# Patient Record
Sex: Male | Born: 1960 | Race: Black or African American | Hispanic: No | Marital: Single | Smoking: Never smoker
Health system: Southern US, Community
[De-identification: ages and names within clinical notes are randomized; demographics above are authoritative.]

## PROBLEM LIST (undated history)

## (undated) DIAGNOSIS — E669 Obesity, unspecified: Secondary | ICD-10-CM

## (undated) DIAGNOSIS — L309 Dermatitis, unspecified: Secondary | ICD-10-CM

## (undated) DIAGNOSIS — E785 Hyperlipidemia, unspecified: Secondary | ICD-10-CM

## (undated) DIAGNOSIS — I10 Essential (primary) hypertension: Secondary | ICD-10-CM

## (undated) DIAGNOSIS — N529 Male erectile dysfunction, unspecified: Secondary | ICD-10-CM

## (undated) DIAGNOSIS — T7840XA Allergy, unspecified, initial encounter: Secondary | ICD-10-CM

## (undated) DIAGNOSIS — G4730 Sleep apnea, unspecified: Secondary | ICD-10-CM

## (undated) DIAGNOSIS — E119 Type 2 diabetes mellitus without complications: Secondary | ICD-10-CM

## (undated) HISTORY — DX: Essential (primary) hypertension: I10

## (undated) HISTORY — DX: Hyperlipidemia, unspecified: E78.5

## (undated) HISTORY — PX: KNEE ARTHROSCOPY: SUR90

## (undated) HISTORY — DX: Obesity, unspecified: E66.9

## (undated) HISTORY — DX: Male erectile dysfunction, unspecified: N52.9

## (undated) HISTORY — DX: Dermatitis, unspecified: L30.9

## (undated) HISTORY — DX: Allergy, unspecified, initial encounter: T78.40XA

## (undated) HISTORY — PX: NASAL SINUS SURGERY: SHX719

---

## 2003-07-29 ENCOUNTER — Emergency Department (HOSPITAL_COMMUNITY): Admission: EM | Admit: 2003-07-29 | Discharge: 2003-07-29 | Payer: Self-pay | Admitting: Emergency Medicine

## 2004-03-24 ENCOUNTER — Ambulatory Visit (HOSPITAL_BASED_OUTPATIENT_CLINIC_OR_DEPARTMENT_OTHER): Admission: RE | Admit: 2004-03-24 | Discharge: 2004-03-24 | Payer: Self-pay | Admitting: Orthopedic Surgery

## 2004-03-24 ENCOUNTER — Ambulatory Visit (HOSPITAL_COMMUNITY): Admission: RE | Admit: 2004-03-24 | Discharge: 2004-03-24 | Payer: Self-pay | Admitting: Orthopedic Surgery

## 2004-05-15 ENCOUNTER — Encounter: Admission: RE | Admit: 2004-05-15 | Discharge: 2004-07-23 | Payer: Self-pay | Admitting: Orthopedic Surgery

## 2005-02-17 ENCOUNTER — Ambulatory Visit: Payer: Self-pay | Admitting: Family Medicine

## 2005-12-10 ENCOUNTER — Emergency Department (HOSPITAL_COMMUNITY): Admission: EM | Admit: 2005-12-10 | Discharge: 2005-12-10 | Payer: Self-pay | Admitting: Emergency Medicine

## 2005-12-22 ENCOUNTER — Ambulatory Visit: Payer: Self-pay | Admitting: Family Medicine

## 2005-12-22 ENCOUNTER — Ambulatory Visit: Payer: Self-pay | Admitting: *Deleted

## 2006-06-11 ENCOUNTER — Ambulatory Visit: Payer: Self-pay | Admitting: Family Medicine

## 2006-06-17 ENCOUNTER — Ambulatory Visit: Payer: Self-pay | Admitting: Family Medicine

## 2006-06-24 ENCOUNTER — Ambulatory Visit: Payer: Self-pay | Admitting: Family Medicine

## 2007-06-09 ENCOUNTER — Telehealth: Payer: Self-pay | Admitting: Family Medicine

## 2007-06-24 ENCOUNTER — Ambulatory Visit: Payer: Self-pay | Admitting: Family Medicine

## 2007-06-24 DIAGNOSIS — I10 Essential (primary) hypertension: Secondary | ICD-10-CM | POA: Insufficient documentation

## 2007-10-18 ENCOUNTER — Ambulatory Visit: Payer: Self-pay | Admitting: Family Medicine

## 2007-11-03 ENCOUNTER — Ambulatory Visit: Payer: Self-pay | Admitting: Family Medicine

## 2007-11-03 DIAGNOSIS — J45909 Unspecified asthma, uncomplicated: Secondary | ICD-10-CM | POA: Insufficient documentation

## 2008-06-26 ENCOUNTER — Ambulatory Visit: Payer: Self-pay | Admitting: Family Medicine

## 2008-06-26 DIAGNOSIS — K429 Umbilical hernia without obstruction or gangrene: Secondary | ICD-10-CM | POA: Insufficient documentation

## 2008-06-26 DIAGNOSIS — F528 Other sexual dysfunction not due to a substance or known physiological condition: Secondary | ICD-10-CM | POA: Insufficient documentation

## 2008-07-16 ENCOUNTER — Telehealth: Payer: Self-pay | Admitting: *Deleted

## 2008-07-25 ENCOUNTER — Telehealth: Payer: Self-pay | Admitting: Family Medicine

## 2008-12-14 ENCOUNTER — Ambulatory Visit: Payer: Self-pay | Admitting: Family Medicine

## 2008-12-14 ENCOUNTER — Encounter: Payer: Self-pay | Admitting: Family Medicine

## 2009-09-16 DIAGNOSIS — H8110 Benign paroxysmal vertigo, unspecified ear: Secondary | ICD-10-CM | POA: Insufficient documentation

## 2009-09-17 ENCOUNTER — Ambulatory Visit: Payer: Self-pay | Admitting: Family Medicine

## 2009-10-02 ENCOUNTER — Ambulatory Visit: Payer: Self-pay | Admitting: Family Medicine

## 2009-10-02 LAB — CONVERTED CEMR LAB
BUN: 13 mg/dL (ref 6–23)
Bilirubin, Direct: 0 mg/dL (ref 0.0–0.3)
CO2: 30 meq/L (ref 19–32)
Cholesterol: 201 mg/dL — ABNORMAL HIGH (ref 0–200)
Direct LDL: 165.1 mg/dL
Eosinophils Absolute: 0 10*3/uL (ref 0.0–0.7)
Eosinophils Relative: 1.1 % (ref 0.0–5.0)
GFR calc non Af Amer: 114.68 mL/min (ref >60)
HCT: 41.5 % (ref 39.0–52.0)
Ketones, urine, test strip: NEGATIVE
Lymphs Abs: 1.8 10*3/uL (ref 0.7–4.0)
MCHC: 33.1 g/dL (ref 30.0–36.0)
Monocytes Relative: 8.3 % (ref 3.0–12.0)
Nitrite: NEGATIVE
PSA: 1.18 ng/mL (ref 0.10–4.00)
Potassium: 3.6 meq/L (ref 3.5–5.1)
Protein, U semiquant: NEGATIVE
Sodium: 139 meq/L (ref 135–145)
Total Bilirubin: 0.8 mg/dL (ref 0.3–1.2)
Total Protein: 7.6 g/dL (ref 6.0–8.3)
Urobilinogen, UA: 1
WBC Urine, dipstick: NEGATIVE
WBC: 4.2 10*3/uL — ABNORMAL LOW (ref 4.5–10.5)
pH: 7

## 2009-10-09 ENCOUNTER — Ambulatory Visit: Payer: Self-pay | Admitting: Family Medicine

## 2009-10-09 DIAGNOSIS — E1169 Type 2 diabetes mellitus with other specified complication: Secondary | ICD-10-CM | POA: Insufficient documentation

## 2009-10-09 DIAGNOSIS — E785 Hyperlipidemia, unspecified: Secondary | ICD-10-CM

## 2009-10-15 ENCOUNTER — Ambulatory Visit: Payer: Self-pay | Admitting: Family Medicine

## 2009-10-15 DIAGNOSIS — M25569 Pain in unspecified knee: Secondary | ICD-10-CM | POA: Insufficient documentation

## 2009-10-16 ENCOUNTER — Encounter (INDEPENDENT_AMBULATORY_CARE_PROVIDER_SITE_OTHER): Payer: Self-pay | Admitting: *Deleted

## 2009-10-22 ENCOUNTER — Encounter (INDEPENDENT_AMBULATORY_CARE_PROVIDER_SITE_OTHER): Payer: Self-pay | Admitting: *Deleted

## 2009-10-23 ENCOUNTER — Ambulatory Visit: Payer: Self-pay | Admitting: Gastroenterology

## 2009-11-06 ENCOUNTER — Ambulatory Visit: Payer: Self-pay | Admitting: Gastroenterology

## 2009-11-28 ENCOUNTER — Ambulatory Visit: Payer: Self-pay | Admitting: Family Medicine

## 2009-11-28 LAB — CONVERTED CEMR LAB
AST: 20 units/L (ref 0–37)
Albumin: 3.4 g/dL — ABNORMAL LOW (ref 3.5–5.2)
Alkaline Phosphatase: 71 units/L (ref 39–117)
Bilirubin, Direct: 0 mg/dL (ref 0.0–0.3)
LDL Cholesterol: 93 mg/dL (ref 0–99)
Total Bilirubin: 0.5 mg/dL (ref 0.3–1.2)
Triglycerides: 59 mg/dL (ref 0.0–149.0)

## 2009-12-05 ENCOUNTER — Ambulatory Visit: Payer: Self-pay | Admitting: Family Medicine

## 2009-12-17 ENCOUNTER — Ambulatory Visit: Payer: Self-pay | Admitting: Family Medicine

## 2009-12-17 DIAGNOSIS — L301 Dyshidrosis [pompholyx]: Secondary | ICD-10-CM | POA: Insufficient documentation

## 2009-12-27 ENCOUNTER — Ambulatory Visit: Payer: Self-pay | Admitting: Family Medicine

## 2010-01-20 ENCOUNTER — Ambulatory Visit: Payer: Self-pay | Admitting: Pulmonary Disease

## 2010-01-21 ENCOUNTER — Ambulatory Visit: Payer: Self-pay | Admitting: Internal Medicine

## 2010-01-22 ENCOUNTER — Telehealth: Payer: Self-pay | Admitting: Pulmonary Disease

## 2010-11-10 ENCOUNTER — Ambulatory Visit: Admit: 2010-11-10 | Payer: Self-pay | Admitting: Family Medicine

## 2010-11-18 NOTE — Assessment & Plan Note (Signed)
Summary: 2 MONTH ROV/NJR   Vital Signs:  Patient profile:   50 year old male Weight:      301 pounds Temp:     98.9 degrees F oral BP sitting:   120 / 88  (left arm) Cuff size:   large  Vitals Entered By: Kern Reap CMA Duncan Dull) (December 05, 2009 9:19 AM)  Reason for Visit follow up labs  History of Present Illness: Jim Green. is a 50 year old male, nonsmoker, who comes in today for evaluation.  Three problems.  He has underlying hypertension and is on lisinopril 20 -- 25 daily.  BP 120/88.  He takes simvastatin 20 mg nightly lipids to draw back to normal.  He is also complaining of a new problem of cough.  He, thinks it's related allergy.  I explained it could be the lisinopril to pain.  He said no, sputum production, etc., etc.  Allergies: No Known Drug Allergies  Past History:  Past medical, surgical, family and social histories (including risk factors) reviewed for relevance to current acute and chronic problems.  Past Medical History: Reviewed history from 10/15/2009 and no changes required. Hypertension obesity eczema allergic rhinitis ED umbilical hernia right knee pain  Past Surgical History: Reviewed history from 10/15/2009 and no changes required. right knee arthroscopy per Dr. Eulah Pont 2005  Family History: Reviewed history from 11/03/2007 and no changes required. Family Hsitory Headaches mother has peripheral vascular disease and coronary disease 3 brothers, one has high blood pressure 3 sisters, one is obese  Social History: Reviewed history from 06/26/2008 and no changes required. Occupation: Married Never Smoked Alcohol use-no Drug use-no Regular exercise-no  Review of Systems      See HPI  Physical Exam  General:  Well-developed,well-nourished,in no acute distress; alert,appropriate and cooperative throughout examination   Impression & Recommendations:  Problem # 1:  HYPERLIPIDEMIA (ICD-272.4) Assessment Improved  His updated medication  list for this problem includes:    Simvastatin 20 Mg Tabs (Simvastatin) .Marland Kitchen... 1 tab @ bedtime  Problem # 2:  HYPERTENSION (ICD-401.9) Assessment: Improved  His updated medication list for this problem includes:    Lisinopril-hydrochlorothiazide 20-25 Mg Tabs (Lisinopril-hydrochlorothiazide) .Marland Kitchen... Take 1 tablet by mouth once a day  Problem # 3:  COUGH (ICD-786.2) Assessment: New  Complete Medication List: 1)  Lisinopril-hydrochlorothiazide 20-25 Mg Tabs (Lisinopril-hydrochlorothiazide) .... Take 1 tablet by mouth once a day 2)  Viagra 100 Mg Tabs (Sildenafil citrate) .... Uad 3)  Simvastatin 20 Mg Tabs (Simvastatin) .Marland Kitchen.. 1 tab @ bedtime  Patient Instructions: 1)  continue the simvastatin 20 mg daily, along with your lisinopril.  Her blood pressure daily. 2)  Take 10 mg of Claritin plain at bedtime.  If after two to 3 weeks.  The cough does not go away and let us know.  Then we would consider changing his lisinopril. 3)  Walk 15 minutes daily. 4)  Return for your annual physical exam

## 2010-11-18 NOTE — Assessment & Plan Note (Signed)
Summary: acute chest wall pain/apc   Visit Type:  Initial Consult Copy to:  pcp Primary Provider/Referring Provider:  Roderick Pee MD  CC:  Pt here for pulmonary consult. Pt c/o chest wall pain with exertion that d/c x 4 to 5 days ago starting x 2 to 3 days .  History of Present Illness: 48/F never smoker for evaluation of chest pain. He presented mid Feb for annual physical & reported a cough attributed to allergies or lisinopril. He then came back on 3/1 with acute onset chest wall pain initially on left then radiating to right.  He described it as dull, a 7 on a scale of one to 10 it was intermittent, not related to exertion.  It does not hurt if he lies still.  If he takes a deep breath or twists or moves. He was out of work x 2 days because of the chest wall pain.  Aleve helped more than motrin & he hsa been pain free x 6 days now.No history of any cardiac or pulmonary symptoms.  No history of trauma. CXR wnl.  On further questioning , he states he has had this pain at other times in the past too. No family h/o thromboembolic episodes. Of note, he underwent what seems like UPPP by Dr Haroldine Laws for snoring in the 90's.  Preventive Screening-Counseling & Management  Alcohol-Tobacco     Alcohol drinks/day: <1     Alcohol type: spirits     Smoking Status: never  Current Medications (verified): 1)  Lisinopril-Hydrochlorothiazide 20-25 Mg Tabs (Lisinopril-Hydrochlorothiazide) .... Take 1 Tablet By Mouth Once A Day 2)  Simvastatin 20 Mg Tabs (Simvastatin) .Marland Kitchen.. 1 Tab @ Bedtime 3)  Triamcinolone Acetonide 0.1 % Crea (Triamcinolone Acetonide) .... Apply Two Times A Day 4)  Claritin 10 Mg Tabs (Loratadine) .... Take 1 Tablet By Mouth Once A Day As Needed  Allergies (verified): No Known Drug Allergies  Past History:  Past Medical History: Hypertension obesity eczema allergic rhinitis ED umbilical hernia right knee pain Hyperlipidemia  Past Surgical History: right knee  arthroscopy per Dr. 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  The patient denies shortness of breath with activity, shortness of breath at rest, productive cough, non-productive cough, coughing up blood, irregular heartbeats, acid heartburn, indigestion, loss of appetite, weight change, abdominal pain, difficulty swallowing, sore throat, tooth/dental problems, headaches, nasal congestion/difficulty breathing through nose, sneezing, itching, ear ache, anxiety, depression, hand/feet swelling, joint stiffness or pain, rash, change in color of mucus, and fever.    Vital Signs:  Patient profile:   50 year old male Height:      67 inches Weight:      308.25 pounds O2 Sat:      94 % on Room air Temp:     98.6 degrees F oral Pulse rate:   78 / minute BP sitting:   126 / 80  (left arm) Cuff size:   large  Vitals Entered By: Zackery Barefoot CMA (January 20, 2010 3:53 PM)  O2 Flow:  Room air CC: Pt here for pulmonary consult. Pt c/o chest wall pain with exertion  that d/c x 4 to 5 days ago starting x 2 to 3 days  Comments Medications reviewed with patient Verified contact number and pharmacy with patient Zackery Barefoot CMA  January 20, 2010 3:55 PM    Physical Exam  Additional Exam:  Gen. Pleasant, well-nourished, in no distress, normal affect ENT - no lesions, no post nasal drip Neck: No JVD, no thyromegaly, no carotid bruits Lungs: no use of accessory muscles, no dullness to percussion, clear without rales or rhonchi  Cardiovascular: Rhythm regular, heart sounds  normal,  no murmurs or gallops, no peripheral edema Abdomen: soft and non-tender, no hepatosplenomegaly, BS normal. Musculoskeletal: No deformities, no cyanosis or clubbing Neuro:  alert, non focal     Impression & Recommendations:  Problem # 1:  CHEST WALL PAIN, ACUTE (ICD-786.52) Likely musculoskeletal , given response to aleve. Doubt pleurisy - no evidence of shingles. Non exertional , less likley cardiac. Low risk for thromboembolic phenomena - will obatin CT chest since he states this has happened before. Orders: Consultation Level III [phone]) Radiology Referral (Radiology)  Medications Added to Medication List This Visit: 1)  Claritin 10 Mg Tabs (Loratadine) .... Take 1 tablet by mouth once a day as needed  Patient Instructions: 1)  Copy sent to: Dr Tawanna Cooler 2)  Please schedule a follow-up appointment after your CT scan.

## 2010-11-18 NOTE — Progress Notes (Signed)
Summary: returned call  Phone Note Call from Patient Call back at Home Phone [phone]   Caller: Patient Call For: alva Summary of Call: Returning call from yesterday. Initial call taken by: Darletta Moll,  January 22, 2010 9:29 AM  Follow-up for Phone Call        [facility] Vernie Murders  January 22, 2010 9:43 AM  pt advised of CT results per append. Carron Curie CMA  January 22, 2010 9:50 AM

## 2010-11-18 NOTE — Miscellaneous (Signed)
Summary: [facility] Previsit/prep  Clinical Lists Changes  Medications: Added new medication of MOVIPREP 100 GM  SOLR (PEG-KCL-NACL-NASULF-NA ASC-C) As per prep instructions. - Signed Rx of MOVIPREP 100 GM  SOLR (PEG-KCL-NACL-NASULF-NA ASC-C) As per prep instructions.;  #1 x 0;  Signed;  Entered by: [name] RN;  Authorized by: Mardella Layman MD [facility];  Method used: Electronically to [facility] [phone]*, [address], [place], [place]  [address], Ph: [phone], Fax: [phone] Observations: Added new observation of NKA: T (10/23/2009 15:45)    Prescriptions: MOVIPREP 100 GM  SOLR (PEG-KCL-NACL-NASULF-NA ASC-C) As per prep instructions.  #1 x 0   Entered by:   [name] RN   Authorized by:   Mardella Layman MD [facility]   Signed by:   [name] RN on 10/23/2009   Method used:   Electronically to        [facility] [phone]* (retail)       [address]       [place], [place]  [address]       Ph: [phone]       Fax: [phone]   RxID:   [id number]

## 2010-11-18 NOTE — Procedures (Signed)
Summary: Colonoscopy  Patient: Jim Green Note: All result statuses are Final unless otherwise noted.  Tests: (1) Colonoscopy (COL)   COL Colonoscopy           DONE     [facility]     [address]     [place], [place]  [address]           COLONOSCOPY PROCEDURE REPORT           PATIENT:  [name], [name]  MR#:  [id number]     BIRTHDATE:  1960-11-28, 48 yrs. old  GENDER:  male           ENDOSCOPIST:  Vania Rea. Jarold Motto, MD, [facility]     Referred by:  Eugenio Hoes Tawanna Cooler, M.D.           PROCEDURE DATE:  11/06/2009     PROCEDURE:  Colonoscopy with snare polypectomy     ASA CLASS:  Class II     INDICATIONS:  Colorectal Cancer Screening           MEDICATIONS:   Fentanyl 75 mcg IV, Versed 9 mg IV           DESCRIPTION OF PROCEDURE:   After the risks benefits and     alternatives of the procedure were thoroughly explained, informed     consent was obtained.  Digital rectal exam was performed and     revealed no abnormalities.   The LB [id number] endoscope     was introduced through the anus and advanced to the cecum, which     was identified by both the appendix and ileocecal valve, limited     by poor preparation.  very poor prep throughout entire colon.  The     quality of the prep was poor, using MoviPrep.  The instrument was     then slowly withdrawn as the colon was fully examined.     <<PROCEDUREIMAGES>>           FINDINGS:  A sessile polyp was found in the descending colon. It     was bilobed and fleshy. 1 cm. polyp Polyp was snared, then     cauterized with monopolar cautery. Retrieval was unsuccessful.     snare polyp poor prep precluded polyp retrieval.  This was     otherwise a normal examination of the colon.   Retroflexed views     in the rectum revealed no abnormalities.    The scope was then     withdrawn from the patient and the procedure completed.           COMPLICATIONS:  None           ENDOSCOPIC IMPRESSION:     1) Sessile polyp in the  descending colon     2) Otherwise normal examination     LEFT COLON ADENOMA RESECTED.INADEQUATE PREP.     RECOMMENDATIONS:     3 YEAR F/U WITH PILL PREP FOLLOWED WITH 1LITER OF GOLYTELY.           REPEAT EXAM:  No           ______________________________     Vania Rea. Jarold Motto, MD, [facility]           CC:           n.     eSIGNED:   Vania Rea. Patterson at 11/06/2009 10:17 AM           Alyse Low, [id number]  Note: An exclamation  mark (!) indicates a result that was not dispersed into the flowsheet. Document Creation Date: 11/06/2009 10:17 AM _______________________________________________________________________  (1) Order result status: Final Collection or observation date-time: 11/06/2009 10:08 Requested date-time:  Receipt date-time:  Reported date-time:  Referring Physician:   Ordering Physician: Sheryn Bison [phone]) Specimen Source:  Source: Launa Grill Order Number: [id number] Lab site:

## 2010-11-18 NOTE — Assessment & Plan Note (Signed)
Summary: back pain/not any better/getting worse/cjr   Vital Signs:  Patient profile:   50 year old male Weight:      300 pounds Temp:     99.4 degrees F oral BP sitting:   110 / 70  (left arm) Cuff size:   large  Vitals Entered By: Kern Reap CMA Duncan Dull) (December 27, 2009 12:25 PM)  Reason for Visit increased back pain  History of Present Illness: Jim Green. is a 50 year old male, nonsmoker, who comes in today for reevaluation of chest wall pain.  He, states he's had this chest wall pain now for two to 3 months.  He describes it as intermittent, dull, and 8 on a scale of one to 10 !!!!!!!!!!!!!!!!!!!!!, located to the right upper anterior chest wall and the right subscapular area.  The discomfort comes and goes........ today  He is asymptomatic.  The pain does not wake him up at night.  He has no cardiac, pulmonary, nor GI symptoms.  He relates the discomfort to after he got a flu shot !!!!!!!!!!!!!.  He states it hurts when he moves or when he lies down and goes to sleep.  The discomfort goes away.  He has no difficulty swallowing, breathing.  The discomfort tends to occur at rest is not exertional.  He has underlying hypertension, and hyperlipidemia both of which are under excellent control.  Allergies: No Known Drug Allergies  Past History:  Past medical, surgical, family and social histories (including risk factors) reviewed, and no changes noted (except as noted below).  Past Medical History: Reviewed history from 10/15/2009 and no changes required. Hypertension obesity eczema allergic rhinitis ED umbilical hernia right knee pain  Past Surgical History: Reviewed history from 10/15/2009 and no changes required. right knee arthroscopy per Dr. Eulah Pont 2005  Family History: Reviewed history from 11/03/2007 and no changes required. Family Hsitory Headaches mother has peripheral vascular disease and coronary disease 3 brothers, one has high blood pressure 3 sisters, one is  obese  Social History: Reviewed history from 06/26/2008 and no changes required. Occupation: Married Never Smoked Alcohol use-no Drug use-no Regular exercise-no  Review of Systems      See HPI  Physical Exam  General:  Well-developed,well-nourished,in no acute distress; alert,appropriate and cooperative throughout examination Neck:  No deformities, masses, or tenderness noted. Chest Wall:  No deformities, masses, tenderness or gynecomastia noted. Lungs:  Normal respiratory effort, chest expands symmetrically. Lungs are clear to auscultation, no crackles or wheezes. Heart:  Normal rate and regular rhythm. S1 and S2 normal without gallop, murmur, click, rub or other extra sounds. Abdomen:  Bowel sounds positive,abdomen soft and non-tender without masses, organomegaly or hernias noted.   Impression & Recommendations:  Problem # 1:  CHEST WALL PAIN, ACUTE (ICD-786.52) Assessment Unchanged  Orders: T-2 View CXR ([id number]) Pulmonary Referral (Pulmonary)  Complete Medication List: 1)  Lisinopril-hydrochlorothiazide 20-25 Mg Tabs (Lisinopril-hydrochlorothiazide) .... Take 1 tablet by mouth once a day 2)  Viagra 100 Mg Tabs (Sildenafil citrate) .... Uad 3)  Simvastatin 20 Mg Tabs (Simvastatin) .Marland Kitchen.. 1 tab @ bedtime 4)  Triamcinolone Acetonide 0.1 % Crea (Triamcinolone acetonide) .... Apply two times a day  Patient Instructions: 1)  go to the main office now for a chest x-ray, we will get you set up for a pulmonary consult next week for further evaluation. 2)  In the meantime take 400 mg of Motrin 3 times a day with food

## 2010-11-18 NOTE — Assessment & Plan Note (Signed)
Summary: back pain/dm   Vital Signs:  Patient profile:   50 year old male Weight:      303 pounds Temp:     98.7 degrees F oral BP sitting:   110 / 70  (left arm) Cuff size:   large  Vitals Entered By: Kern Reap CMA Duncan Dull) (December 17, 2009 12:15 PM)  Reason for Visit follow up with back pain  History of Present Illness: Jim Green is a 50 year old male, nonsmoker, who comes in today for evaluation of multiple issues.  He has a history of present nasal drip and cough.  We start him on Claritin, 10 mg regular two weeks ago.  His symptoms are pretty much gone.  He takes lisinopril 20 -- 25 daily for hypertension.  BP 110/70.  He's been having some chest wall pain.  He describes it as dull, a 7 on a scale of one to 10 it is intermittent.  It does not hurt if he lies still.  If he takes a deep breath or twists or moves.  It hurts.  He was out of work yesterday and today because of the chest wall pain.  No history of any cardiac or pulmonary symptoms.  No history of trauma.  He also complaining of dryness and peeling of his hands.  Allergies: No Known Drug Allergies  Past History:  Past medical, surgical, family and social histories (including risk factors) reviewed for relevance to current acute and chronic problems. Past medical history reviewed for relevance to current acute and chronic problems.  Past Medical History: Reviewed history from 10/15/2009 and no changes required. Hypertension obesity eczema allergic rhinitis ED umbilical hernia right knee pain  Past Surgical History: Reviewed history from 10/15/2009 and no changes required. right knee arthroscopy per Dr. Eulah Pont 2005  Family History: Reviewed history from 11/03/2007 and no changes required. Family Hsitory Headaches mother has peripheral vascular disease and coronary disease 3 brothers, one has high blood pressure 3 sisters, one is obese  Social History: Reviewed history from 06/26/2008 and no  changes required. Occupation: Married Never Smoked Alcohol use-no Drug use-no Regular exercise-no  Review of Systems      See HPI  Physical Exam  General:  Well-developed,well-nourished,in no acute distress; alert,appropriate and cooperative throughout examination Neck:  No deformities, masses, or tenderness noted. Chest Wall:  No deformities, masses, tenderness or gynecomastia noted. Lungs:  Normal respiratory effort, chest expands symmetrically. Lungs are clear to auscultation, no crackles or wheezes. Heart:  Normal rate and regular rhythm. S1 and S2 normal without gallop, murmur, click, rub or other extra sounds. Skin:  bilateral eczema   Impression & Recommendations:  Problem # 1:  COUGH (ICD-786.2) Assessment Improved  Orders: Prescription Created Electronically [phone])  Problem # 2:  CHEST WALL PAIN, ACUTE (TKZ-601.09) Assessment: Unchanged  Orders: Prescription Created Electronically [phone])  Problem # 3:  HYPERTENSION (ICD-401.9) Assessment: Improved  His updated medication list for this problem includes:    Lisinopril-hydrochlorothiazide 20-25 Mg Tabs (Lisinopril-hydrochlorothiazide) .Marland Kitchen... Take 1 tablet by mouth once a day  Orders: Prescription Created Electronically [phone])  Problem # 4:  DYSHIDROSIS (ICD-705.81) Assessment: New  Orders: Prescription Created Electronically ([phone])  Complete Medication List: 1)  Lisinopril-hydrochlorothiazide 20-25 Mg Tabs (Lisinopril-hydrochlorothiazide) .... Take 1 tablet by mouth once a day 2)  Viagra 100 Mg Tabs (Sildenafil citrate) .... Uad 3)  Simvastatin 20 Mg Tabs (Simvastatin) .Marland Kitchen.. 1 tab @ bedtime 4)  Triamcinolone Acetonide 0.1 % Crea (Triamcinolone acetonide) .... Apply two times a day  Patient  Instructions: 1)  continue the regular Claritin, 10 mg daily. 2)  Continue your blood pressure medication daily. 3)  Take Motrin 600 mg twice a day with food for the chest wall pain. 4)  Applied.  The steroid  cream.  Small amounts twice a day as needed for the eczema     Prescriptions: TRIAMCINOLONE ACETONIDE 0.1 % CREA (TRIAMCINOLONE ACETONIDE) apply two times a day  #60 gr x 3   Entered and Authorized by:   Roderick Pee MD   Signed by:   Roderick Pee MD on 12/17/2009   Method used:   Electronically to        [facility] [phone]* (retail)       [address]       [place], [place]  [address]       Ph: [phone]       Fax: [phone]   RxID:   [id number]

## 2010-11-18 NOTE — Letter (Signed)
Summary: [facility] Instructions  [facility]  [address] [place], [place] [address]   Phone: [phone]  Fax: [phone]       Jim Green    [birth year]    MRN: [id number]        Procedure Day /Date: 11/06/09/  Wednesday     Arrival Time: 8:30am     Procedure Time: 9:30am     Location of Procedure:                    _ x_  [facility] (4th Floor)  PREPARATION FOR COLONOSCOPY WITH MOVIPREP   Starting 5 days prior to your procedure _ 1/14/11_ do not eat nuts, seeds, popcorn, corn, beans, peas,  salads, or any raw vegetables.  Do not take any fiber supplements (e.g. Metamucil, Citrucel, and Benefiber).  THE DAY BEFORE YOUR PROCEDURE         DATE: 11/05/09 DAY: Tuesday  1.  Drink clear liquids the entire day-NO SOLID FOOD  2.  Do not drink anything colored red or purple.  Avoid juices with pulp.  No orange juice.  3.  Drink at least 64 oz. (8 glasses) of fluid/clear liquids during the day to prevent dehydration and help the prep work efficiently.  CLEAR LIQUIDS INCLUDE: Water Jello Ice Popsicles Tea (sugar ok, no milk/cream) Powdered fruit flavored drinks Coffee (sugar ok, no milk/cream) Gatorade Juice: apple, white grape, white cranberry  Lemonade Clear bullion, consomm, broth Carbonated beverages (any kind) Strained chicken noodle soup Hard Candy                             4.  In the morning, mix first dose of MoviPrep solution:    Empty 1 Pouch A and 1 Pouch B into the disposable container    Add lukewarm drinking water to the top line of the container. Mix to dissolve    Refrigerate (mixed solution should be used within 24 hrs)  5.  Begin drinking the prep at 5:00 p.m. The MoviPrep container is divided by 4 marks.   Every 15 minutes drink the solution down to the next mark (approximately 8 oz) until the full liter is complete.   6.  Follow completed prep with 16 oz of clear liquid of your choice (Nothing red or purple).   Continue to drink clear liquids until bedtime.  7.  Before going to bed, mix second dose of MoviPrep solution:    Empty 1 Pouch A and 1 Pouch B into the disposable container    Add lukewarm drinking water to the top line of the container. Mix to dissolve    Refrigerate  THE DAY OF YOUR PROCEDURE      DATE:  11/06/09  DAY: Wednesday  Beginning at 4:30a.m. (5 hours before procedure):         1. Every 15 minutes, drink the solution down to the next mark (approx 8 oz) until the full liter is complete.  2. Follow completed prep with 16 oz. of clear liquid of your choice.    3. You may drink clear liquids until 7:30 am (2 HOURS BEFORE PROCEDURE).   MEDICATION INSTRUCTIONS  Unless otherwise instructed, you should take regular prescription medications with a small sip of water   as early as possible the morning of your procedure.            OTHER INSTRUCTIONS  You will need a responsible adult  at least 52 years of age to accompany you and drive you home.   This person must remain in the waiting room during your procedure.  Wear loose fitting clothing that is easily removed.  Leave jewelry and other valuables at home.  However, you may wish to bring a book to read or  an iPod/MP3 player to listen to music as you wait for your procedure to start.  Remove all body piercing jewelry and leave at home.  Total time from sign-in until discharge is approximately 2-3 hours.  You should go home directly after your procedure and rest.  You can resume normal activities the  day after your procedure.  The day of your procedure you should not:   Drive   Make legal decisions   Operate machinery   Drink alcohol   Return to work  You will receive specific instructions about eating, activities and medications before you leave.    The above instructions have been reviewed and explained to me by   [name] RN  October 23, 2009 4:14 PM     I fully understand and can  verbalize these instructions _____________________________ Date _________   Appended Document: Moviprep Instructions Hold Lisinopril/hctz the morning of procedure.

## 2010-11-18 NOTE — Letter (Signed)
Summary: Out of Work  Adult nurse at [employer]  [address]   [place], [place] [address]   Phone: [phone]  Fax: [phone]    December 17, 2009   Employee:  Alyse Low    To Whom It May Concern:   For Medical reasons, please excuse the above named employee from work for the following dates:  Start:   December 16, 2009  End:   December 18, 2009  If you need additional information, please feel free to contact our office.         Sincerely,    Kelle Darting, MD

## 2010-12-14 ENCOUNTER — Other Ambulatory Visit: Payer: Self-pay | Admitting: Family Medicine

## 2011-01-06 ENCOUNTER — Encounter: Payer: Self-pay | Admitting: Family Medicine

## 2011-01-07 ENCOUNTER — Ambulatory Visit (INDEPENDENT_AMBULATORY_CARE_PROVIDER_SITE_OTHER): Payer: Commercial Managed Care - PPO | Admitting: Family Medicine

## 2011-01-07 ENCOUNTER — Encounter: Payer: Self-pay | Admitting: Family Medicine

## 2011-01-07 DIAGNOSIS — E669 Obesity, unspecified: Secondary | ICD-10-CM

## 2011-01-07 DIAGNOSIS — M25511 Pain in right shoulder: Secondary | ICD-10-CM

## 2011-01-07 DIAGNOSIS — E785 Hyperlipidemia, unspecified: Secondary | ICD-10-CM

## 2011-01-07 DIAGNOSIS — M21619 Bunion of unspecified foot: Secondary | ICD-10-CM

## 2011-01-07 DIAGNOSIS — L301 Dyshidrosis [pompholyx]: Secondary | ICD-10-CM

## 2011-01-07 DIAGNOSIS — B351 Tinea unguium: Secondary | ICD-10-CM

## 2011-01-07 DIAGNOSIS — M25519 Pain in unspecified shoulder: Secondary | ICD-10-CM

## 2011-01-07 DIAGNOSIS — I10 Essential (primary) hypertension: Secondary | ICD-10-CM

## 2011-01-07 LAB — PSA: PSA: 0.66 ng/mL (ref 0.10–4.00)

## 2011-01-07 LAB — BASIC METABOLIC PANEL
CO2: 29 mEq/L (ref 19–32)
GFR: 115.59 mL/min (ref >60.00)
Glucose, Bld: 125 mg/dL — ABNORMAL HIGH (ref 70–99)
Potassium: 3.9 mEq/L (ref 3.5–5.1)
Sodium: 135 mEq/L (ref 135–145)

## 2011-01-07 LAB — POCT URINALYSIS DIPSTICK
Bilirubin, UA: NEGATIVE
Glucose, UA: NEGATIVE
Ketones, UA: NEGATIVE
Leukocytes, UA: NEGATIVE
Spec Grav, UA: 1.02

## 2011-01-07 LAB — CBC WITH DIFFERENTIAL/PLATELET
Basophils Absolute: 0 10*3/uL (ref 0.0–0.1)
Basophils Relative: 0.6 % (ref 0.0–3.0)
HCT: 42.4 % (ref 39.0–52.0)
Hemoglobin: 14.2 g/dL (ref 13.0–17.0)
Lymphs Abs: 2.2 10*3/uL (ref 0.7–4.0)
Monocytes Relative: 11.6 % (ref 3.0–12.0)
Neutro Abs: 2.3 10*3/uL (ref 1.4–7.7)
RDW: 15.3 % — ABNORMAL HIGH (ref 11.5–14.6)

## 2011-01-07 LAB — HEPATIC FUNCTION PANEL
ALT: 19 U/L (ref 0–53)
AST: 20 U/L (ref 0–37)
Albumin: 3.8 g/dL (ref 3.5–5.2)
Total Protein: 7.1 g/dL (ref 6.0–8.3)

## 2011-01-07 LAB — LIPID PANEL
Cholesterol: 166 mg/dL (ref 0–200)
VLDL: 14.8 mg/dL (ref 0.0–40.0)

## 2011-01-07 MED ORDER — LISINOPRIL-HYDROCHLOROTHIAZIDE 20-25 MG PO TABS: 1.0000 | ORAL_TABLET | Freq: Every day | ORAL | Status: DC

## 2011-01-07 MED ORDER — SIMVASTATIN 20 MG PO TABS: 20.0000 mg | ORAL_TABLET | Freq: Every day | ORAL | Status: DC

## 2011-01-07 NOTE — Patient Instructions (Signed)
You may take Motrin 400 mg twice daily with food for pain in the right shoulder.  Use over-the-counter antifungal cream twice daily until the fungal infection completely clear as this may take two to 3 months.  The orthopedist is Dr. Toni Arthurs for your bunions.  Return sometime in the next two to 3 weeks for a 30 minute appointment for general physical exam.  We will do all your laboratory today

## 2011-01-07 NOTE — Progress Notes (Signed)
  Subjective:    Patient ID: Jim Green, male    DOB: 12-Aug-1961, 50 y.o.   MRN: [id number]  HPIG. Is a 50 year old male, who comes in today for evaluation of 4 problems.  He said, nontraumatic pain in his right shoulder for two months.  He states he sleeps on that side.  He is right-handed.  Full range of motion.  He has problems with hyperhidrosis, it is morbidly obese at 303 pounds.  He is a fungal infection in his feet.  He has bilateral bunions.  He is overdue for his physical exam.    Review of Systems    General a musculoskeletal review of systems otherwise negative Objective:   Physical Exam    Well-developed well-nourished man in acute distress.  Examination of the right shoulder shows full range of motion.  No tenderness.  He does have a fungal infection between his toes.  He does have onychomycosis of both great toenails.  He does have bilateral bunions    Assessment & Plan:  Right shoulder pain recommend Motrin 400 b.i.d., p.r.n.  Hyper hydration as  Fungal infection OTC Mycolog b.i.d.  Bilateral bunions refer to Dr. Victorino Dike  foot surgeon.

## 2011-01-27 ENCOUNTER — Ambulatory Visit (INDEPENDENT_AMBULATORY_CARE_PROVIDER_SITE_OTHER): Payer: Commercial Managed Care - PPO | Admitting: Family Medicine

## 2011-01-27 ENCOUNTER — Encounter: Payer: Self-pay | Admitting: Family Medicine

## 2011-01-27 DIAGNOSIS — Z136 Encounter for screening for cardiovascular disorders: Secondary | ICD-10-CM

## 2011-01-27 DIAGNOSIS — E1165 Type 2 diabetes mellitus with hyperglycemia: Secondary | ICD-10-CM

## 2011-01-27 DIAGNOSIS — E785 Hyperlipidemia, unspecified: Secondary | ICD-10-CM

## 2011-01-27 DIAGNOSIS — IMO0001 Reserved for inherently not codable concepts without codable children: Secondary | ICD-10-CM

## 2011-01-27 DIAGNOSIS — I10 Essential (primary) hypertension: Secondary | ICD-10-CM

## 2011-01-27 MED ORDER — SIMVASTATIN 20 MG PO TABS: 20.0000 mg | ORAL_TABLET | Freq: Every day | ORAL | Status: DC

## 2011-01-27 MED ORDER — LISINOPRIL-HYDROCHLOROTHIAZIDE 20-25 MG PO TABS: 1.0000 | ORAL_TABLET | Freq: Every day | ORAL | Status: DC

## 2011-01-27 MED ORDER — METFORMIN HCL 500 MG PO TABS: 500.0000 mg | ORAL_TABLET | Freq: Every day | ORAL | Status: DC

## 2011-01-27 NOTE — Patient Instructions (Signed)
Begin a walking program 15 minutes daily.  We will set up a nutrition consult with the diabetic center.  Begin metformin 500 mg daily prior to breakfast.  Check a fasting blood sugar daily in the morning.  Follow-up in two weeks,,,,,,,,,, bringing all your blood sugar data which you  Continue the pedis side, Claritin, and Zocor.  Add a baby aspirin daily

## 2011-01-27 NOTE — Progress Notes (Signed)
  Subjective:    Patient ID: Jim Green, male    DOB: 06/22/1961, 50 y.o.   MRN: [id number]  HPIgirardeau Is a 50 year old single male, who lives with his girlfriend, who comes in today for general physical examination because of a history of hypertension, allergic rhinitis, and hyperlipidemia, and a new diagnosis of diabetes.  He takes prednisone.  I20 to 25 for hypertension.  BP at home 130/80.  He takes 10 mg a Claritin nightly for allergic rhinitis.  He takes Zocor 20 nightly for hyperlipidemia.  Lipids are at goal.  His blood sugar last year was 93.  The sugars 125A1c7.2.  Mother, brother, sister, all have diabetes.  Tetanus 2004, colonoscopy, 2011.  Weight 308 pounds    Review of Systems  Constitutional: Negative.   HENT: Negative.   Eyes: Negative.   Respiratory: Negative.   Cardiovascular: Negative.   Gastrointestinal: Negative.   Genitourinary: Negative.   Musculoskeletal: Negative.   Skin: Negative.   Neurological: Negative.   Hematological: Negative.   Psychiatric/Behavioral: Negative.        Objective:   Physical Exam  Constitutional: He is oriented to person, place, and time. He appears well-developed and well-nourished.  HENT:  Head: Normocephalic and atraumatic.  Right Ear: External ear normal.  Left Ear: External ear normal.  Nose: Nose normal.  Mouth/Throat: Oropharynx is clear and moist.  Eyes: Conjunctivae and EOM are normal. Pupils are equal, round, and reactive to light.  Neck: Normal range of motion. Neck supple. No JVD present. No tracheal deviation present. No thyromegaly present.  Cardiovascular: Normal rate, regular rhythm, normal heart sounds and intact distal pulses.  Exam reveals no gallop and no friction rub.   No murmur heard. Pulmonary/Chest: Effort normal and breath sounds normal. No stridor. No respiratory distress. He has no wheezes. He has no rales. He exhibits no tenderness.  Abdominal: Soft. Bowel sounds are normal. He  exhibits no distension and no mass. There is no tenderness. There is no rebound and no guarding.  Genitourinary: Rectum normal, prostate normal and penis normal. Guaiac negative stool. No penile tenderness.  Musculoskeletal: Normal range of motion. He exhibits no edema and no tenderness.  Lymphadenopathy:    He has no cervical adenopathy.  Neurological: He is alert and oriented to person, place, and time. He has normal reflexes. No cranial nerve deficit. He exhibits normal muscle tone.  Skin: Skin is warm and dry. No rash noted. No erythema. No pallor.  Psychiatric: He has a normal mood and affect. His behavior is normal. Judgment and thought content normal.          Assessment & Plan:  Hypertension continue Zestoretic 20 to 25 daily.  Allergic rhinitis.  Continue Claritin 10 mg q.a.m.  Hyperlipidemia.  Continue Zocor 20 nightly  New onset diabetes begin metformin 500 mg q.a.m., diabetic teaching nurse consult, exercise, daily, follow blood sugar in two months.  Obesity diet, exercise and weight loss

## 2011-02-04 ENCOUNTER — Telehealth: Payer: Self-pay | Admitting: Family Medicine

## 2011-02-04 NOTE — Telephone Encounter (Signed)
Spoke with patient.

## 2011-02-04 NOTE — Telephone Encounter (Signed)
Pt has ran out of testing needles and will need more today. This is new for him and he does not know what brand they were. Wants nurse to return call today.

## 2011-02-10 ENCOUNTER — Ambulatory Visit: Payer: Commercial Managed Care - PPO | Admitting: Family Medicine

## 2011-02-16 ENCOUNTER — Encounter: Payer: Commercial Managed Care - PPO | Attending: Family Medicine

## 2011-02-16 DIAGNOSIS — E119 Type 2 diabetes mellitus without complications: Secondary | ICD-10-CM | POA: Insufficient documentation

## 2011-02-16 DIAGNOSIS — Z713 Dietary counseling and surveillance: Secondary | ICD-10-CM | POA: Insufficient documentation

## 2011-02-17 ENCOUNTER — Encounter: Payer: Self-pay | Admitting: Family Medicine

## 2011-02-17 ENCOUNTER — Ambulatory Visit (INDEPENDENT_AMBULATORY_CARE_PROVIDER_SITE_OTHER): Payer: Commercial Managed Care - PPO | Admitting: Family Medicine

## 2011-02-17 VITALS — BP 128/80 | Temp 98.2°F | Wt 302.0 lb

## 2011-02-17 DIAGNOSIS — IMO0001 Reserved for inherently not codable concepts without codable children: Secondary | ICD-10-CM

## 2011-02-17 DIAGNOSIS — E1165 Type 2 diabetes mellitus with hyperglycemia: Secondary | ICD-10-CM

## 2011-02-17 MED ORDER — ONETOUCH DELICA LANCETS MISC
1.0000 | Status: DC
Start: 2011-02-17 — End: 2012-08-17

## 2011-02-17 MED ORDER — GLUCOSE BLOOD VI STRP: 1.0000 | ORAL_STRIP | Status: DC

## 2011-02-17 NOTE — Progress Notes (Signed)
  Subjective:    Patient ID: Jim Green, male    DOB: 1961-05-04, 50 y.o.   MRN: [id number]  HPI  G. Is a 50 year old male, who comes in today for follow-up of new onset diabetes, type II.  His blood sugar was in the 125 range with an A1c of 7.2%.  We start him on metformin 500 q.a.m., now.  Blood sugars around 100.  He is going to the diabetic nutrition classes.   Review of Systems      General and metabolic review of systems negative.  No hypoglycemia Objective:   Physical Exam    Well-developed well-nourished, male in no acute distress    Assessment & Plan:  New onset diabetes, type II,,,,,,,,,,, continue current therapy follow-up in 3 months

## 2011-02-17 NOTE — Patient Instructions (Signed)
Continue your current treatment program.  If you see with time.,  Diet, exercise, weight loss that your blood sugar begins to drop below 70 and then Decrease the Glucophage to one half tablet q.a.m. Before breakfast.  Follow-up in 3 months,,,,,,,,, labs one week prior

## 2011-03-06 NOTE — Op Note (Signed)
[name], [name]                         ACCOUNT NO.:  [id number]   MEDICAL RECORD NO.:  [id number]                   PATIENT TYPE:  AMB   LOCATION:  [facility]                                  FACILITY:  [facility]   PHYSICIAN:  Robert A. Thurston Hole, M.D.              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   PROCEDURE:  1.  Right knee EUA followed by arthroscopic partial medial and lateral      meniscectomies.  2.  Right knee chondroplasty with partial synovectomy.   SURGEON:  Dr. Salvatore Marvel.   ASSISTANT:  Kirstin Tomasa Rand, P.A.   ANESTHESIA:  1.  Local.  2.  MAC.   OPERATIVE TIME:  30 minutes.   COMPLICATIONS:  None.   INDICATIONS FOR PROCEDURE:  [name] is a 50 year old gentleman who has had  6-8 months of increasing right knee pain with exam and MRI documenting  medial meniscal tear, chondromalacia and synovitis, who has failed  conservative care and is now to undergo arthroscopy.   DESCRIPTION:  [name] was brought to the operating room on March 24, 2004,  after a knee block had been placed in the holding room by anesthesia.  He  was placed on the operative table in the supine position.  His right knee  was examined under anesthesia.  Range of motion 0-125 degrees, 1-2+  crepitation, knee stable.  Ligamentous exam with normal patella tracking.  The right leg was prepped using sterile DuraPrep and draped using sterile  technique.  Originally through an anterolateral portal the arthroscope with  a pump attached was placed into an anteromedial portal and arthroscopic  probe was placed.  On initial inspection of the medial compartment, he was  found to have  50% grade 3 chondromalacia which was debrided.  Medial  meniscus showed tearing in the posterior medial horn of which 25-30% was  resected back to a stable rim.  Intercondylar notch inspected, anterior and  posterior cruciate ligaments were normal.  Lateral compartment inspected.  Articular cartilage had mild grade 1-2 chondral malacia.  Lateral meniscus  showed tearing 20% posterior lateral corner which was resected back to a  stable rim.  Patellofemoral joint showed mild grade 1-2 chondromalacia.  The  patella tracked normally.  Moderate synovitis in medial and lateral gutters  were debrided, otherwise they are free of pathology.  After this was done,  it was felt that all pathology had been satisfactorily addressed, the  instruments were removed.  The portal was closed with 3-0 nylon suture and  injected with 0.25% Marcaine with epinephrine and 4 mg of morphine.  A  sterile  dressing is applied, the patient awakened and taken to the recovery  room in stable condition.   FOLLOWUP CARE:  [name] will be followed as an outpatient on Vicodin and  Naprosyn.  He will be seen back in the office in a week for sutures out and  followup.                                               Robert A. Thurston Hole, M.D.    RAW/MEDQ  D:  06/25/2004  T:  06/25/2004  Job:  [id number]

## 2011-05-18 ENCOUNTER — Other Ambulatory Visit: Payer: Commercial Managed Care - PPO

## 2011-05-25 ENCOUNTER — Ambulatory Visit: Payer: Commercial Managed Care - PPO | Admitting: Family Medicine

## 2012-01-28 ENCOUNTER — Ambulatory Visit (INDEPENDENT_AMBULATORY_CARE_PROVIDER_SITE_OTHER): Payer: Commercial Managed Care - PPO | Admitting: Family Medicine

## 2012-01-28 ENCOUNTER — Encounter: Payer: Self-pay | Admitting: Family Medicine

## 2012-01-28 VITALS — BP 120/84 | Temp 98.8°F | Wt 292.0 lb

## 2012-01-28 DIAGNOSIS — B349 Viral infection, unspecified: Secondary | ICD-10-CM

## 2012-01-28 DIAGNOSIS — B9789 Other viral agents as the cause of diseases classified elsewhere: Secondary | ICD-10-CM

## 2012-01-28 MED ORDER — HYDROCODONE-HOMATROPINE 5-1.5 MG/5ML PO SYRP: ORAL_SOLUTION | ORAL | Status: DC

## 2012-01-28 MED ORDER — CIPROFLOXACIN-HYDROCORTISONE 0.2-1 % OT SUSP: OTIC | Status: DC

## 2012-01-28 NOTE — Patient Instructions (Signed)
Drink lots of water  Eye drops twice daily until redness:  Hydromet 1/2-1 teaspoon at bedtime when necessary for cough and cold  For your back pain I would recommend you begin an exercise program,,,,,,,,,, Chapter 1 in the yoga book

## 2012-01-28 NOTE — Progress Notes (Signed)
  Subjective:    Patient ID: Alyse Low, male    DOB: 18-Mar-1961, 51 y.o.   MRN: [id number]  HPI Mr. Klingbeil is a 51 year old male who comes in today for day history of head congestion sore throat cough and left conjunctivitis  He states he was around some nieces and nephews a couple weeks ago who had viruses   Review of Systems General and ENT rate review of systems negative no fever no sputum production    Objective:   Physical Exam Well-developed well-nourished male in no acute distress HEENT negative except for conjunctivitis left eye neck was supple no adenopathy lungs are clear       Assessment & Plan:  Viral syndrome can't treat symptomatically with Hydromet and eyedrops

## 2012-01-29 ENCOUNTER — Telehealth: Payer: Self-pay | Admitting: Family Medicine

## 2012-01-29 MED ORDER — CIPROFLOXACIN HCL 0.3 % OP SOLN: OPHTHALMIC | Status: DC

## 2012-01-29 NOTE — Telephone Encounter (Signed)
rx sent to pharmacy

## 2012-01-29 NOTE — Telephone Encounter (Signed)
Fleet Contras please change this to the ophthalmic drops

## 2012-01-29 NOTE — Telephone Encounter (Signed)
Pt went to [facility] on [address] to pick up drops for his eye,and pharmacist told pt that the drops that were called in were for the ear, so pharmacy will not fill script. Pt said that his eye is getting worse and needs this script corrected today.

## 2012-02-10 ENCOUNTER — Other Ambulatory Visit: Payer: Self-pay | Admitting: Family Medicine

## 2012-03-07 ENCOUNTER — Other Ambulatory Visit: Payer: Self-pay | Admitting: Family Medicine

## 2012-05-31 ENCOUNTER — Encounter: Payer: Self-pay | Admitting: Family Medicine

## 2012-05-31 ENCOUNTER — Ambulatory Visit (INDEPENDENT_AMBULATORY_CARE_PROVIDER_SITE_OTHER): Payer: Commercial Managed Care - PPO | Admitting: Family Medicine

## 2012-05-31 VITALS — BP 120/80 | Temp 98.8°F | Wt 293.0 lb

## 2012-05-31 DIAGNOSIS — J45909 Unspecified asthma, uncomplicated: Secondary | ICD-10-CM

## 2012-05-31 MED ORDER — PREDNISONE 20 MG PO TABS: ORAL_TABLET | ORAL | Status: DC

## 2012-05-31 MED ORDER — HYDROCODONE-HOMATROPINE 5-1.5 MG/5ML PO SYRP: 5.0000 mL | ORAL_SOLUTION | Freq: Three times a day (TID) | ORAL | Status: AC | PRN

## 2012-05-31 NOTE — Progress Notes (Signed)
  Subjective:    Patient ID: Jim Green, male    DOB: 10-21-1960, 51 y.o.   MRN: [id number]  HPI Jim Green is a 51 year old married male nonsmoker who comes in with a cough for 6 days  None of his family members or coworkers have been male.  Last Thursday he developed a cough. No fever earache sore throat etc. etc. Now the cough seems to be getting worse and he feels tightness and wheezing. He's never had asthma in the past   Review of Systems    general and pulmonary review of systems otherwise negative Objective:   Physical Exam  Well-developed well nourished male no acute distress HEENT negative neck was supple no adenopathy lungs are clear except for symmetrical late expiratory mild wheezing      Assessment & Plan:  Viral syndrome with secondary asthma plan treat symptomatically with liquids cough suppressant and a short course of prednisone

## 2012-05-31 NOTE — Patient Instructions (Signed)
Drink lots of water  Hydromet,,,,,,,,,, 1/2-1 teaspoon 3 times a day as needed for cough  Prednisone,,,,,,,,, 2 tabs daily for 3 days or until you feel a lot better and then begin to taper as outlined  Return.,

## 2012-08-17 ENCOUNTER — Encounter (HOSPITAL_COMMUNITY): Payer: Self-pay

## 2012-08-17 ENCOUNTER — Emergency Department (HOSPITAL_COMMUNITY)
Admission: EM | Admit: 2012-08-17 | Discharge: 2012-08-17 | Disposition: A | Discharge status: Home / Self Care | Payer: Commercial Managed Care - PPO | Attending: Emergency Medicine | Admitting: Emergency Medicine

## 2012-08-17 ENCOUNTER — Emergency Department (HOSPITAL_COMMUNITY): Payer: Commercial Managed Care - PPO

## 2012-08-17 DIAGNOSIS — Z872 Personal history of diseases of the skin and subcutaneous tissue: Secondary | ICD-10-CM | POA: Insufficient documentation

## 2012-08-17 DIAGNOSIS — M79605 Pain in left leg: Secondary | ICD-10-CM

## 2012-08-17 DIAGNOSIS — M549 Dorsalgia, unspecified: Secondary | ICD-10-CM

## 2012-08-17 DIAGNOSIS — Z8739 Personal history of other diseases of the musculoskeletal system and connective tissue: Secondary | ICD-10-CM | POA: Insufficient documentation

## 2012-08-17 DIAGNOSIS — E785 Hyperlipidemia, unspecified: Secondary | ICD-10-CM | POA: Insufficient documentation

## 2012-08-17 DIAGNOSIS — E669 Obesity, unspecified: Secondary | ICD-10-CM | POA: Insufficient documentation

## 2012-08-17 DIAGNOSIS — Z79899 Other long term (current) drug therapy: Secondary | ICD-10-CM | POA: Insufficient documentation

## 2012-08-17 DIAGNOSIS — I700 Atherosclerosis of aorta: Secondary | ICD-10-CM

## 2012-08-17 DIAGNOSIS — I10 Essential (primary) hypertension: Secondary | ICD-10-CM | POA: Insufficient documentation

## 2012-08-17 DIAGNOSIS — M79609 Pain in unspecified limb: Secondary | ICD-10-CM | POA: Insufficient documentation

## 2012-08-17 DIAGNOSIS — M25559 Pain in unspecified hip: Secondary | ICD-10-CM | POA: Insufficient documentation

## 2012-08-17 DIAGNOSIS — Z87448 Personal history of other diseases of urinary system: Secondary | ICD-10-CM | POA: Insufficient documentation

## 2012-08-17 DIAGNOSIS — B353 Tinea pedis: Secondary | ICD-10-CM

## 2012-08-17 DIAGNOSIS — G4730 Sleep apnea, unspecified: Secondary | ICD-10-CM | POA: Insufficient documentation

## 2012-08-17 DIAGNOSIS — B351 Tinea unguium: Secondary | ICD-10-CM | POA: Insufficient documentation

## 2012-08-17 HISTORY — DX: Sleep apnea, unspecified: G47.30

## 2012-08-17 MED ORDER — MELOXICAM 15 MG PO TABS: 15.0000 mg | ORAL_TABLET | Freq: Every day | ORAL | Status: DC

## 2012-08-17 MED ORDER — NYSTATIN-TRIAMCINOLONE 100000-0.1 UNIT/GM-% EX OINT: TOPICAL_OINTMENT | Freq: Two times a day (BID) | CUTANEOUS | Status: DC

## 2012-08-17 NOTE — ED Provider Notes (Signed)
History     CSN: [id number]  Arrival date & time 08/17/12  0945   First MD Initiated Contact with Patient 08/17/12 1016      Chief Complaint  Patient presents with  . Leg Pain  . Hip Pain    (Consider location/radiation/quality/duration/timing/severity/associated sxs/prior treatment) HPI Comments: Jim Green 51 y.o. male   The chief complaint is: Patient presents with:   Leg Pain   Hip Pain    Left leg, hip and lower bank pain x1 month. Intermittent. Worse at the end of the day. Throbbing, aching. Denies numbness or tingling. Denies swelling, heat or redness. Meredeth Ide a lot at work, difficulty performing tasks at work. Worsened with activity and positional change.  Took 600 mg advil with minimal relief.  Difficulty sleeping. Leg pain wakes him from sleep when he lays on that side. Denies fevers, chills, myalgias. Denies DOE, SOB, chest tightness or pressure, radiation to left arm, jaw or back, or diaphoresis. Denies dysuria, flank pain, suprapubic pain, frequency, urgency, or hematuria. Denies headaches, light headedness, weakness, visual disturbances. Denies abdominal pain, nausea, vomiting, diarrhea or constipation. Denies weakness, loss of bowel/bladder function or saddle anesthesia.     Patient is a 51 y.o. male presenting with leg pain and hip pain. The history is provided by the patient. No language interpreter was used.  Leg Pain  Pertinent negatives include no numbness.  Hip Pain Associated symptoms include arthralgias (HX OA ). Pertinent negatives include no abdominal pain, chest pain, chills, coughing, fever, joint swelling, myalgias, numbness, rash or vomiting.    Past Medical History  Diagnosis Date  . Hypertension   . Obesity   . Eczema   . Allergy   . ED (erectile dysfunction)   . Hyperlipidemia   . Sleep apnea     Past Surgical History  Procedure Date  . Knee arthroscopy     right knee    Family History  Problem Relation Age of Onset  .  Heart disease    . Hypertension    . Obesity      History  Substance Use Topics  . Smoking status: Never Smoker   . Smokeless tobacco: Not on file  . Alcohol Use: 2.0 oz/week    4 drink(s) per week      Review of Systems  Constitutional: Negative for fever and chills.  Respiratory: Negative for cough and shortness of breath.   Cardiovascular: Negative for chest pain and palpitations.  Gastrointestinal: Negative for vomiting, abdominal pain, diarrhea and constipation.  Genitourinary: Negative for dysuria, urgency and frequency.  Musculoskeletal: Positive for back pain, arthralgias (HX OA ) and gait problem. Negative for myalgias and joint swelling.  Skin: Negative for rash.  Neurological: Negative for numbness.  All other systems reviewed and are negative.    Allergies  Review of patient's allergies indicates no known allergies.  Home Medications   Current Outpatient Rx  Name Route Sig Dispense Refill  . LISINOPRIL-HYDROCHLOROTHIAZIDE 20-25 MG PO TABS  TAKE ONE TABLET BY MOUTH EVERY DAY 100 tablet 1  . METFORMIN HCL 500 MG PO TABS  TAKE ONE TABLET BY MOUTH EVERY DAY WITH BREAKFAST 100 tablet 3  . SIMVASTATIN 20 MG PO TABS  TAKE ONE TABLET BY MOUTH EVERY DAY AT BEDTIME 100 tablet 1    BP 137/74  Pulse 83  Temp 98 F (36.7 C) (Oral)  Resp 16  Ht 5\' 8"  (1.727 m)  Wt 291 lb (131.997 kg)  BMI 44.25 kg/m2  SpO2 99%  Physical  Exam  Nursing note and vitals reviewed. Constitutional: He appears well-developed and well-nourished. No distress.       Obese   HENT:  Head: Normocephalic and atraumatic.  Eyes: Conjunctivae normal are normal. No scleral icterus.  Neck: Normal range of motion. Neck supple.  Cardiovascular: Normal rate, regular rhythm and normal heart sounds.   Pulmonary/Chest: Effort normal and breath sounds normal. No respiratory distress.  Abdominal: Soft. There is no tenderness.       Obese abdomen  Musculoskeletal:       FROM right knee. NO Crepitus  or pain.  Tender to palpation of the left hip.  Mild tenderness with internal and external rotation.  Tender to palpation of the left gluteal region.  No bony tenderness noted.  Neurological: He is alert.  Skin: Skin is warm and dry. He is not diaphoretic.       Bilateral feet with thickened yellow nails peeling of skin.  Dry and cracked.  Pruritic.  Psychiatric: His behavior is normal.    ED Course  Procedures (including critical care time)  Labs Reviewed - No data to display No results found.   1. Left leg pain   2. Back pain   3. Tinea pedis   4. Onychomycosis   5. Atherosclerosis of aorta       MDM  Patient with likely Musculoskeletal and OA associated pain.  sxs not consistent with caludicaiotn. Incidental findings of aortic stenosis. Patient informed. Patient also with signs of tinea. Will have patent FU with PCP on all issues. Discussed reasons to seek immediate care. Patient expresses understanding and agrees with plan.         Arthor Captain, PA-C 08/22/12 2212

## 2012-08-17 NOTE — ED Notes (Signed)
Pt sts Left leg and hip pain "off and on" x 1 month.  Sts pain increasing x 2 weeks.  Sts pain increases with movement.  Sts occasional pain in RLE.  Denies injury to leg or back.  Denies swelling or redness.  Sts slight warmth.  Pain score 5/10.

## 2012-08-22 NOTE — ED Provider Notes (Signed)
Medical screening examination/treatment/procedure(s) were performed by non-physician practitioner and as supervising physician I was immediately available for consultation/collaboration.   Celene Kras, MD 08/22/12 [phone]

## 2012-08-29 ENCOUNTER — Encounter: Payer: Self-pay | Admitting: Family Medicine

## 2012-08-29 ENCOUNTER — Ambulatory Visit (INDEPENDENT_AMBULATORY_CARE_PROVIDER_SITE_OTHER): Payer: Commercial Managed Care - PPO | Admitting: Family Medicine

## 2012-08-29 VITALS — BP 120/80 | Temp 98.3°F | Wt 294.0 lb

## 2012-08-29 DIAGNOSIS — E785 Hyperlipidemia, unspecified: Secondary | ICD-10-CM

## 2012-08-29 DIAGNOSIS — E1165 Type 2 diabetes mellitus with hyperglycemia: Secondary | ICD-10-CM

## 2012-08-29 DIAGNOSIS — M549 Dorsalgia, unspecified: Secondary | ICD-10-CM

## 2012-08-29 DIAGNOSIS — G8929 Other chronic pain: Secondary | ICD-10-CM

## 2012-08-29 DIAGNOSIS — I700 Atherosclerosis of aorta: Secondary | ICD-10-CM

## 2012-08-29 DIAGNOSIS — IMO0001 Reserved for inherently not codable concepts without codable children: Secondary | ICD-10-CM

## 2012-08-29 DIAGNOSIS — I10 Essential (primary) hypertension: Secondary | ICD-10-CM

## 2012-08-29 MED ORDER — TRAMADOL HCL 50 MG PO TABS: 50.0000 mg | ORAL_TABLET | Freq: Three times a day (TID) | ORAL | Status: DC | PRN

## 2012-08-29 MED ORDER — METFORMIN HCL 500 MG PO TABS: 500.0000 mg | ORAL_TABLET | Freq: Every day | ORAL | Status: DC

## 2012-08-29 NOTE — Patient Instructions (Addendum)
Take Motrin 400 mg twice daily with food for your low back pain  Tramadol 50 mg,,,,,,,,, 1/2-1 tablet at bedtime as needed for severe pain  We will get you set up for an evaluation of your aorta

## 2012-08-29 NOTE — Progress Notes (Signed)
  Subjective:    Patient ID: Jim Green, male    DOB: [birth year], 51 y.o.   MRN: [id number]  HPI G. is a 51 year old male who has underlying hypertension diabetes and hyperlipidemia who comes in today for evaluation of 2 problems  He was seen in the emergency room on October 30 for evaluation of back pain. At that time he was having left lower lumbar back pain that been going on and off for about 6 months. Prior to that day he pain became constant. He describes it as dull comes and goes as 7 on a scale of 1-10 points to left lumbar is a source of pain and states it radiates down to his left knee. He was seen in the emergency room x-ray shows some degenerative changes L4-L5 some arthritic changes and a calcification in his aorta which they described as advanced arteriosclerotic type changes. He's here today for evaluation of both problems. Vascular-wise he's asymptomatic  Back pain is decreased since he has been on the Mobic.   Review of Systems Gen. metabolic neuromuscular review of systems otherwise negative    Objective:   Physical Exam  Well-developed well-nourished male in no acute distress  Examination of back shows no palpable tenderness  In the supine position both legs were of equal length. Sensation muscle strength reflexes within normal limits straight leg raising negative  Abdominal exam shows no audible bruit the femorals popliteals dorsalis pedis posterior tabs 3+ out of 4      Assessment & Plan:

## 2012-09-02 ENCOUNTER — Encounter: Payer: Self-pay | Admitting: Family Medicine

## 2012-09-12 ENCOUNTER — Other Ambulatory Visit: Payer: Self-pay | Admitting: Cardiology

## 2012-09-12 DIAGNOSIS — I700 Atherosclerosis of aorta: Secondary | ICD-10-CM

## 2012-09-19 ENCOUNTER — Encounter (INDEPENDENT_AMBULATORY_CARE_PROVIDER_SITE_OTHER): Payer: Commercial Managed Care - PPO

## 2012-09-19 DIAGNOSIS — I700 Atherosclerosis of aorta: Secondary | ICD-10-CM

## 2012-09-23 ENCOUNTER — Encounter: Payer: Self-pay | Admitting: Gastroenterology

## 2012-10-03 ENCOUNTER — Other Ambulatory Visit: Payer: Self-pay | Admitting: Family Medicine

## 2012-10-03 MED ORDER — LISINOPRIL-HYDROCHLOROTHIAZIDE 20-25 MG PO TABS: 1.0000 | ORAL_TABLET | Freq: Every day | ORAL | Status: DC

## 2012-10-03 NOTE — Telephone Encounter (Signed)
Pt needs refill of lisinopril-hydrochlorothiazide 20/25mg   90 day supply Pt is out, pls refill if poss today. [facility] [employer]

## 2012-12-27 ENCOUNTER — Telehealth: Payer: Self-pay | Admitting: Family Medicine

## 2012-12-27 NOTE — Telephone Encounter (Signed)
Patient Information:  Caller Name: Jim Green  Phone: [phone]  Patient: Jim Green  Gender: Male  DOB: [birth year]  Age: 52 Years  PCP: Kelle Darting [facility])  Office Follow Up:  Does the office need to follow up with this patient?: Yes  Instructions For The Office: NO APPTS REMAININg W/ DR KIM OR CAMPBELL, PLEASE CALL PT BACK FOR APPT NEEDS  RN Note:  Pt was trying to strach inside Ear w/ Ink Pen, end of Pen broke off.  Pt denies pain, hearing changes or oozing from Ear. Pt in route to office.   Symptoms  Reason For Call & Symptoms: ER CALL. Ink Pen head broke off inside Ear  Reviewed Health History In EMR: N/A  Reviewed Medications In EMR: N/A  Reviewed Allergies In EMR: N/A  Reviewed Surgeries / Procedures: N/A  Date of Onset of Symptoms: 12/27/2012  Guideline(s) Used:  Ear Injury  Disposition Per Guideline:   See Today or Tomorrow in Office  Reason For Disposition Reached:   Patient wants to be seen  Advice Given:  N/A

## 2013-05-23 ENCOUNTER — Other Ambulatory Visit: Payer: Self-pay | Admitting: Family Medicine

## 2013-05-28 ENCOUNTER — Other Ambulatory Visit: Payer: Self-pay | Admitting: Family Medicine

## 2013-06-15 ENCOUNTER — Encounter: Payer: Self-pay | Admitting: Gastroenterology

## 2013-09-07 ENCOUNTER — Other Ambulatory Visit: Payer: Self-pay | Admitting: Family Medicine

## 2013-09-09 ENCOUNTER — Other Ambulatory Visit: Payer: Self-pay | Admitting: Family Medicine

## 2013-09-11 ENCOUNTER — Other Ambulatory Visit: Payer: Self-pay | Admitting: *Deleted

## 2013-09-11 MED ORDER — LISINOPRIL-HYDROCHLOROTHIAZIDE 20-25 MG PO TABS: ORAL_TABLET | ORAL | Status: DC

## 2013-10-03 ENCOUNTER — Telehealth: Payer: Self-pay | Admitting: Family Medicine

## 2013-10-03 MED ORDER — HYDROCODONE-HOMATROPINE 5-1.5 MG/5ML PO SYRP: ORAL_SOLUTION | ORAL | Status: DC

## 2013-10-03 NOTE — Telephone Encounter (Signed)
Spoke with patient and he has a cough with no fever

## 2013-10-03 NOTE — Telephone Encounter (Signed)
Pt has bad cough, runny nose, dripping down the back of his throat and making him throw up. Pt request appt or meds pls advise [facility]/ [address]

## 2013-10-03 NOTE — Telephone Encounter (Signed)
Pt following up on request.

## 2013-10-03 NOTE — Telephone Encounter (Signed)
Rx per Dr Tawanna Cooler and patient is aware

## 2013-12-26 ENCOUNTER — Telehealth: Payer: Self-pay | Admitting: Family Medicine

## 2013-12-26 NOTE — Telephone Encounter (Signed)
Left message on machine for patient to call and schedule an appointment for refills

## 2013-12-26 NOTE — Telephone Encounter (Signed)
Pt is needing new rx lisinopril-hydrochlorothiazide (PRINZIDE,ZESTORETIC) 20-25 MG per tablet, pt states he is completely out of his bp pills as need as soon as possible also need rx for simvstatin (zocor) 20 mg send to [facility] on [address].

## 2013-12-27 MED ORDER — LISINOPRIL-HYDROCHLOROTHIAZIDE 20-25 MG PO TABS: ORAL_TABLET | ORAL | Status: DC

## 2013-12-27 NOTE — Telephone Encounter (Signed)
Spoke to pt told him Rx sent to pharmacy make sure keep appointment on Monday. Pt verbalized understanding.

## 2013-12-27 NOTE — Telephone Encounter (Signed)
Pt has made appt for Monday and pt would like to know if you could refill 30 days. Pt has been out for 4 days [facility]/ [address] lisinopril-hydrochlorothiazide (PRINZIDE,ZESTORETIC) 20-25 MG per tablet

## 2014-01-01 ENCOUNTER — Encounter: Payer: Self-pay | Admitting: Family Medicine

## 2014-01-01 ENCOUNTER — Ambulatory Visit (INDEPENDENT_AMBULATORY_CARE_PROVIDER_SITE_OTHER): Payer: Commercial Managed Care - PPO | Admitting: Family Medicine

## 2014-01-01 VITALS — BP 110/80 | Temp 98.9°F | Wt 293.0 lb

## 2014-01-01 DIAGNOSIS — E785 Hyperlipidemia, unspecified: Secondary | ICD-10-CM

## 2014-01-01 DIAGNOSIS — L301 Dyshidrosis [pompholyx]: Secondary | ICD-10-CM

## 2014-01-01 DIAGNOSIS — E1110 Type 2 diabetes mellitus with ketoacidosis without coma: Secondary | ICD-10-CM

## 2014-01-01 DIAGNOSIS — E1310 Other specified diabetes mellitus with ketoacidosis without coma: Secondary | ICD-10-CM

## 2014-01-01 DIAGNOSIS — F528 Other sexual dysfunction not due to a substance or known physiological condition: Secondary | ICD-10-CM

## 2014-01-01 DIAGNOSIS — I10 Essential (primary) hypertension: Secondary | ICD-10-CM

## 2014-01-01 LAB — CBC WITH DIFFERENTIAL/PLATELET
BASOS ABS: 0 10*3/uL (ref 0.0–0.1)
Basophils Relative: 0.5 % (ref 0.0–3.0)
Eosinophils Absolute: 0.1 10*3/uL (ref 0.0–0.7)
Eosinophils Relative: 1.2 % (ref 0.0–5.0)
HEMATOCRIT: 42.2 % (ref 39.0–52.0)
Hemoglobin: 13.7 g/dL (ref 13.0–17.0)
LYMPHS ABS: 2.2 10*3/uL (ref 0.7–4.0)
Lymphocytes Relative: 38.9 % (ref 12.0–46.0)
MCHC: 32.5 g/dL (ref 30.0–36.0)
MCV: 86.9 fl (ref 78.0–100.0)
MONO ABS: 0.6 10*3/uL (ref 0.1–1.0)
Monocytes Relative: 11.2 % (ref 3.0–12.0)
NEUTROS PCT: 48.2 % (ref 43.0–77.0)
Neutro Abs: 2.7 10*3/uL (ref 1.4–7.7)
PLATELETS: 233 10*3/uL (ref 150.0–400.0)
RBC: 4.86 Mil/uL (ref 4.22–5.81)
RDW: 15.7 % — AB (ref 11.5–14.6)
WBC: 5.7 10*3/uL (ref 4.5–10.5)

## 2014-01-01 LAB — HEPATIC FUNCTION PANEL
ALT: 22 U/L (ref 0–53)
AST: 22 U/L (ref 0–37)
Albumin: 3.9 g/dL (ref 3.5–5.2)
Alkaline Phosphatase: 68 U/L (ref 39–117)
BILIRUBIN TOTAL: 0.6 mg/dL (ref 0.3–1.2)
Bilirubin, Direct: 0.1 mg/dL (ref 0.0–0.3)
TOTAL PROTEIN: 7.1 g/dL (ref 6.0–8.3)

## 2014-01-01 LAB — BASIC METABOLIC PANEL
BUN: 17 mg/dL (ref 6–23)
CHLORIDE: 100 meq/L (ref 96–112)
CO2: 32 meq/L (ref 19–32)
Calcium: 9 mg/dL (ref 8.4–10.5)
Creatinine, Ser: 1 mg/dL (ref 0.4–1.5)
GFR: 103.47 mL/min (ref >60.00)
GLUCOSE: 92 mg/dL (ref 70–99)
POTASSIUM: 3.8 meq/L (ref 3.5–5.1)
Sodium: 137 mEq/L (ref 135–145)

## 2014-01-01 LAB — LIPID PANEL
CHOLESTEROL: 194 mg/dL (ref 0–200)
HDL: 39.7 mg/dL (ref >39.00)
LDL Cholesterol: 134 mg/dL — ABNORMAL HIGH (ref 0–99)
Total CHOL/HDL Ratio: 5
Triglycerides: 100 mg/dL (ref 0.0–149.0)
VLDL: 20 mg/dL (ref 0.0–40.0)

## 2014-01-01 LAB — POCT URINALYSIS DIPSTICK
Bilirubin, UA: NEGATIVE
Blood, UA: NEGATIVE
Glucose, UA: NEGATIVE
Ketones, UA: NEGATIVE
LEUKOCYTES UA: NEGATIVE
NITRITE UA: NEGATIVE
PH UA: 7
PROTEIN UA: NEGATIVE
Spec Grav, UA: 1.025
UROBILINOGEN UA: 4

## 2014-01-01 LAB — MICROALBUMIN / CREATININE URINE RATIO
Creatinine,U: 176.9 mg/dL
Microalb Creat Ratio: 0.1 mg/g (ref 0.0–30.0)
Microalb, Ur: 0.2 mg/dL (ref 0.0–1.9)

## 2014-01-01 LAB — TSH: TSH: 2.11 u[IU]/mL (ref 0.35–5.50)

## 2014-01-01 LAB — HEMOGLOBIN A1C: Hgb A1c MFr Bld: 6.7 % — ABNORMAL HIGH (ref 4.6–6.5)

## 2014-01-01 LAB — PSA: PSA: 1.07 ng/mL (ref 0.10–4.00)

## 2014-01-01 MED ORDER — SIMVASTATIN 20 MG PO TABS
ORAL_TABLET | ORAL | Status: DC
Start: 2014-01-01 — End: 2014-01-31

## 2014-01-01 MED ORDER — TRIAMCINOLONE ACETONIDE 0.025 % EX OINT: 1.0000 "application " | TOPICAL_OINTMENT | Freq: Two times a day (BID) | CUTANEOUS | Status: DC

## 2014-01-01 MED ORDER — METFORMIN HCL 500 MG PO TABS: ORAL_TABLET | ORAL | Status: DC

## 2014-01-01 MED ORDER — LISINOPRIL-HYDROCHLOROTHIAZIDE 20-25 MG PO TABS: ORAL_TABLET | ORAL | Status: DC

## 2014-01-01 NOTE — Progress Notes (Signed)
Pre visit review using our clinic review tool, if applicable. No additional management support is needed unless otherwise documented below in the visit note. 

## 2014-01-01 NOTE — Patient Instructions (Signed)
Continue your current medications  Triamcinolone gel........ small amounts twice daily for hand eczema  Labs today  Set up a time in the next month or 2 for general physical exam  Begin a walking program 30 minutes daily

## 2014-01-01 NOTE — Progress Notes (Signed)
   Subjective:    Patient ID: Jim Green, male    DOB: [birth year], 53 y.o.   MRN: [id number]  HPI Jim Green is a [age] your male who was last year March 2012 3 years ago who comes in an 3180 renew his medications  He takes Zestoretic 20-12.5 daily for hypertension BP 110/80  He takes metformin 500 mg daily for breakfast he says he checks his blood sugar 3 times weekly and arranges in the 120 range. He uses simvastatin 20 mg for hyperlipidemia. He uses Mycolog for eczema of his hands. Notch otherwise I Mycolog it's not fungal  Last physical 3 years ago   Review of Systems    review of systems otherwise negative except as weights 293 pounds Objective:   Physical Exam  Well-developed overweight male no acute distress vital signs stable he is afebrile      Assessment & Plan:  Hypertension continue........... continue current medication  Hyperlipidemia check labs  Diabetes check labs  Overweight again discussed diet exercise and weight loss

## 2014-01-02 ENCOUNTER — Telehealth: Payer: Self-pay | Admitting: Family Medicine

## 2014-01-02 NOTE — Telephone Encounter (Signed)
Relevant patient education mailed to patient.  

## 2014-01-10 ENCOUNTER — Telehealth: Payer: Self-pay

## 2014-01-10 NOTE — Telephone Encounter (Signed)
Relevant patient education mailed to patient.  

## 2014-01-31 ENCOUNTER — Encounter: Payer: Self-pay | Admitting: Family Medicine

## 2014-01-31 ENCOUNTER — Ambulatory Visit (INDEPENDENT_AMBULATORY_CARE_PROVIDER_SITE_OTHER): Payer: Commercial Managed Care - PPO | Admitting: Family Medicine

## 2014-01-31 VITALS — BP 110/80 | Temp 98.9°F | Ht 68.5 in | Wt 300.0 lb

## 2014-01-31 DIAGNOSIS — E119 Type 2 diabetes mellitus without complications: Secondary | ICD-10-CM | POA: Insufficient documentation

## 2014-01-31 DIAGNOSIS — E785 Hyperlipidemia, unspecified: Secondary | ICD-10-CM

## 2014-01-31 DIAGNOSIS — Z0000 Encounter for general adult medical examination without abnormal findings: Secondary | ICD-10-CM

## 2014-01-31 DIAGNOSIS — I10 Essential (primary) hypertension: Secondary | ICD-10-CM

## 2014-01-31 DIAGNOSIS — IMO0002 Reserved for concepts with insufficient information to code with codable children: Secondary | ICD-10-CM | POA: Insufficient documentation

## 2014-01-31 DIAGNOSIS — Z23 Encounter for immunization: Secondary | ICD-10-CM

## 2014-01-31 DIAGNOSIS — F528 Other sexual dysfunction not due to a substance or known physiological condition: Secondary | ICD-10-CM

## 2014-01-31 DIAGNOSIS — E1110 Type 2 diabetes mellitus with ketoacidosis without coma: Secondary | ICD-10-CM

## 2014-01-31 DIAGNOSIS — E1165 Type 2 diabetes mellitus with hyperglycemia: Secondary | ICD-10-CM | POA: Insufficient documentation

## 2014-01-31 MED ORDER — LISINOPRIL-HYDROCHLOROTHIAZIDE 20-25 MG PO TABS: ORAL_TABLET | ORAL | Status: DC

## 2014-01-31 MED ORDER — METFORMIN HCL 500 MG PO TABS: ORAL_TABLET | ORAL | Status: DC

## 2014-01-31 MED ORDER — SIMVASTATIN 20 MG PO TABS: ORAL_TABLET | ORAL | Status: DC

## 2014-01-31 NOTE — Progress Notes (Signed)
Pre visit review using our clinic review tool, if applicable. No additional management support is needed unless otherwise documented below in the visit note. 

## 2014-01-31 NOTE — Progress Notes (Signed)
   Subjective:    Patient ID: Jim Green, male    DOB: September 17, 1961, 53 y.o.   MRN: [id number]  HPI Jim Green is a 53 year old married male nonsmoker who comes in today for general physical exam because of a history of hypertension, diabetes, hyperlipidemia, obesity,.......... the classic metabolic syndrome  His weight is around 300 pounds. He says he walks daily and is not following a strict diet. His blood sugar on metformin 500 mg daily is in the 90 range. His A1c is less than 7%.  He takes lisinopril 20/25 daily for hypertension BP 110/80  He takes simvastatin 20 mg daily at bedtime because of a history of hyperlipidemia LDL is 134.  He does not get routine eye care nor dental care.  Colonoscopy 2 years ago normal  Vaccinations up-to-date   Review of Systems  Constitutional: Negative.   HENT: Negative.   Eyes: Negative.   Respiratory: Negative.   Cardiovascular: Negative.   Gastrointestinal: Negative.   Genitourinary: Negative.   Musculoskeletal: Negative.   Skin: Negative.   Neurological: Negative.   Psychiatric/Behavioral: Negative.        Objective:   Physical Exam  Nursing note and vitals reviewed. Constitutional: He is oriented to person, place, and time. He appears well-developed and well-nourished.  HENT:  Head: Normocephalic and atraumatic.  Right Ear: External ear normal.  Left Ear: External ear normal.  Nose: Nose normal.  Mouth/Throat: Oropharynx is clear and moist.  Eyes: Conjunctivae and EOM are normal. Pupils are equal, round, and reactive to light.  Neck: Normal range of motion. Neck supple. No JVD present. No tracheal deviation present. No thyromegaly present.  Cardiovascular: Normal rate, regular rhythm, normal heart sounds and intact distal pulses.  Exam reveals no gallop and no friction rub.   No murmur heard. No carotid nor aortic bruits. Peripheral pulses 2+ and symmetrical  Pulmonary/Chest: Effort normal and breath sounds normal. No  stridor. No respiratory distress. He has no wheezes. He has no rales. He exhibits no tenderness.  Abdominal: Soft. Bowel sounds are normal. He exhibits no distension and no mass. There is no tenderness. There is no rebound and no guarding.  Masses panniculus  Genitourinary: Rectum normal, prostate normal and penis normal. Guaiac negative stool. No penile tenderness.  Musculoskeletal: Normal range of motion. He exhibits no edema and no tenderness.  Lymphadenopathy:    He has no cervical adenopathy.  Neurological: He is alert and oriented to person, place, and time. He has normal reflexes. No cranial nerve deficit. He exhibits normal muscle tone.  No neuropathy  Skin: Skin is warm and dry. No rash noted. No erythema. No pallor.  Psychiatric: He has a normal mood and affect. His behavior is normal. Judgment and thought content normal.   Poor dentition missing teeth and some that are broken off       Assessment & Plan:  Metabolic syndrome...........Marland Kitchen. blood sugar normal on medication........Marland Kitchen. blood pressure normal on medication......... lipids normal on medication.......... continues to be very very overweight.......... discussed diet exercise and weight loss

## 2014-01-31 NOTE — Patient Instructions (Signed)
Eye exam Dr. Sol Blazingon Digby  Dental consult  Walk 30 minutes daily  Work hard on your diet this year  Return in 6 months for followup on your diabetes and weight. Labs nonfasting one week prior

## 2014-07-23 ENCOUNTER — Telehealth: Payer: Self-pay | Admitting: Family Medicine

## 2014-07-23 NOTE — Telephone Encounter (Signed)
Opened in error

## 2014-07-26 ENCOUNTER — Other Ambulatory Visit: Payer: Commercial Managed Care - PPO

## 2014-08-02 ENCOUNTER — Ambulatory Visit: Payer: Commercial Managed Care - PPO | Admitting: Family Medicine

## 2014-12-04 ENCOUNTER — Emergency Department (HOSPITAL_COMMUNITY)
Admission: EM | Admit: 2014-12-04 | Discharge: 2014-12-04 | Disposition: A | Discharge status: Home / Self Care | Payer: Commercial Managed Care - PPO | Source: Home / Self Care | Attending: Family Medicine | Admitting: Family Medicine

## 2014-12-04 ENCOUNTER — Emergency Department (INDEPENDENT_AMBULATORY_CARE_PROVIDER_SITE_OTHER): Payer: Commercial Managed Care - PPO

## 2014-12-04 ENCOUNTER — Encounter (HOSPITAL_COMMUNITY): Payer: Self-pay | Admitting: Emergency Medicine

## 2014-12-04 DIAGNOSIS — M1712 Unilateral primary osteoarthritis, left knee: Secondary | ICD-10-CM

## 2014-12-04 DIAGNOSIS — M25552 Pain in left hip: Secondary | ICD-10-CM

## 2014-12-04 DIAGNOSIS — S8392XA Sprain of unspecified site of left knee, initial encounter: Secondary | ICD-10-CM | POA: Diagnosis not present

## 2014-12-04 DIAGNOSIS — M179 Osteoarthritis of knee, unspecified: Secondary | ICD-10-CM | POA: Diagnosis not present

## 2014-12-04 MED ORDER — DICLOFENAC POTASSIUM 50 MG PO TABS: 50.0000 mg | ORAL_TABLET | Freq: Three times a day (TID) | ORAL | Status: DC

## 2014-12-04 NOTE — ED Provider Notes (Signed)
CSN: [id number]     Arrival date & time 12/04/14  1058 History   First MD Initiated Contact with Patient 12/04/14 1222     Chief Complaint  Patient presents with  . Knee Pain   (Consider location/radiation/quality/duration/timing/severity/associated sxs/prior Treatment) HPI Comments: 54 year old male who has a job in which she has to remain on his feet and a lot of walking is complaining of sharp pains to the left knee for 2-3 months. Today he turned to walk in a different direction and experienced acute pain to the medial aspect of the left knee. It is worse with ambulation and occasionally weightbearing. This morning it was swollen but now most of the swelling has diminished significantly. Is also complaining of left hip pain off and on for the past 8-9 days. No known injury.   Past Medical History  Diagnosis Date  . Hypertension   . Obesity   . Eczema   . Allergy   . ED (erectile dysfunction)   . Hyperlipidemia   . Sleep apnea    Past Surgical History  Procedure Laterality Date  . Knee arthroscopy      right knee   Family History  Problem Relation Age of Onset  . Heart disease    . Hypertension    . Obesity     History  Substance Use Topics  . Smoking status: Never Smoker   . Smokeless tobacco: Not on file  . Alcohol Use: 2.0 oz/week    4 drink(s) per week    Review of Systems  Constitutional: Negative.   Respiratory: Negative.   Gastrointestinal: Negative.   Genitourinary: Negative.   Musculoskeletal: Positive for arthralgias. Negative for back pain, neck pain and neck stiffness.       As per HPI  Skin: Negative.   Neurological: Negative for dizziness, weakness, numbness and headaches.  All other systems reviewed and are negative.   Allergies  Review of patient's allergies indicates no known allergies.  Home Medications   Prior to Admission medications   Medication Sig Start Date End Date Taking? Authorizing Provider  lisinopril-hydrochlorothiazide  (PRINZIDE,ZESTORETIC) 20-25 MG per tablet TAKE ONE TABLET BY MOUTH EVERY DAY 01/31/14  Yes Roderick Pee, MD  metFORMIN (GLUCOPHAGE) 500 MG tablet TAKE ONE TABLET BY MOUTH EVERY DAY WITH BREAKFAST 01/31/14  Yes Roderick Pee, MD  simvastatin (ZOCOR) 20 MG tablet TAKE ONE TABLET BY MOUTH EVERY DAY AT BEDTIME 01/31/14  Yes Roderick Pee, MD  diclofenac (CATAFLAM) 50 MG tablet Take 1 tablet (50 mg total) by mouth 3 (three) times daily. One tablet TID with food prn pain. 12/04/14   Hayden Rasmussen, NP  nystatin-triamcinolone ointment [facility]) Apply topically 2 (two) times daily. 08/17/12   Arthor Captain, PA-C  triamcinolone (KENALOG) 0.025 % ointment Apply 1 application topically 2 (two) times daily. 01/01/14   Roderick Pee, MD   BP 145/86 mmHg  Pulse 66  Temp(Src) 98.5 F (36.9 C) (Oral)  Resp 16  SpO2 96% Physical Exam  Constitutional: He is oriented to person, place, and time. He appears well-developed and well-nourished.  HENT:  Head: Normocephalic and atraumatic.  Eyes: EOM are normal. Left eye exhibits no discharge.  Neck: Normal range of motion. Neck supple.  Pulmonary/Chest: Effort normal. No respiratory distress.  Musculoskeletal:  There are varicosities over the left knee and lower extremity. No appreciable swelling. No discoloration or deformity. Palpation reveals tenderness to the medial aspect and just medial to the joint space. There is a curvilinear line of  tenderness from the medial joint space anteriorly to the midline. No tenderness to the tibia anteriorly or the condyles. No tenderness to the patella or patellar femoral ligament.  Neurological: He is alert and oriented to person, place, and time. No cranial nerve deficit.  Skin: Skin is warm and dry.  Psychiatric: He has a normal mood and affect.  Nursing note and vitals reviewed.   ED Course  Procedures (including critical care time) Labs Review Labs Reviewed - No data to display  Imaging Review Dg Knee Complete 4 Views  Left  12/04/2014   CLINICAL DATA:  Anterior knee pain with popping. No recent injury. History of diabetes. Initial encounter.  EXAM: LEFT KNEE - COMPLETE 4+ VIEW  COMPARISON:  None.  FINDINGS: The mineralization and alignment are normal. There is no evidence of acute fracture or dislocation. Tricompartmental degenerative changes are most advanced within the medial compartment. There are possible small central osteophytes. No significant joint effusion.  IMPRESSION: No acute osseous findings.  Tricompartmental degenerative changes.   Electronically Signed   By: Carey BullocksWilliam  Veazey M.D.   On: 12/04/2014 13:24     MDM   1. Left knee sprain, initial encounter   2. Osteoarthritis of left knee, unspecified osteoarthritis type   3. Hip pain, left    Knee immobilizer  Cataflam 3 times a day when necessary Limited weightbearing for 2-3 days Ice to knee Follow-up here PCP.   Hayden Rasmussenavid Shekia Kuper, NP 12/04/14 1352

## 2014-12-04 NOTE — ED Notes (Signed)
Pt reports having a lot of hip pain prior to the knee pain.   Pt states today while at work he twisted his left knee and heard a pop.   Mild swelling and having pain.   No otc meds taken.

## 2014-12-04 NOTE — Discharge Instructions (Signed)
Arthralgia Arthralgia is joint pain. A joint is a place where two bones meet. Joint pain can happen for many reasons. The joint can be bruised, stiff, infected, or weak from aging. Pain usually goes away after resting and taking medicine for soreness.  HOME CARE  Rest the joint as told by your doctor.  Keep the sore joint raised (elevated) for the first 24 hours.  Put ice on the joint area.  Put ice in a plastic bag.  Place a towel between your skin and the bag.  Leave the ice on for 15-20 minutes, 03-04 times a day.  Wear your splint, casting, elastic bandage, or sling as told by your doctor.  Only take medicine as told by your doctor. Do not take aspirin.  Use crutches as told by your doctor. Do not put weight on the joint until told to by your doctor. GET HELP RIGHT AWAY IF:   You have bruising, puffiness (swelling), or more pain.  Your fingers or toes turn blue or start to lose feeling (numb).  Your medicine does not lessen the pain.  Your pain becomes severe.  You have a temperature by mouth above 102 F (38.9 C), not controlled by medicine.  You cannot move or use the joint. MAKE SURE YOU:   Understand these instructions.  Will watch your condition.  Will get help right away if you are not doing well or get worse. Document Released: 09/23/2009 Document Revised: 12/28/2011 Document Reviewed: 09/23/2009 [facility] Patient Information 2015 [place], [place]. This information is not intended to replace advice given to you by your health care provider. Make sure you discuss any questions you have with your health care provider.  Hip Pain Your hip is the joint between your upper legs and your lower pelvis. The bones, cartilage, tendons, and muscles of your hip joint perform a lot of work each day supporting your body weight and allowing you to move around. Hip pain can range from a minor ache to severe pain in one or both of your hips. Pain may be felt on the inside of the  hip joint near the groin, or the outside near the buttocks and upper thigh. You may have swelling or stiffness as well.  HOME CARE INSTRUCTIONS   Take medicines only as directed by your health care provider.  Apply ice to the injured area:  Put ice in a plastic bag.  Place a towel between your skin and the bag.  Leave the ice on for 15-20 minutes at a time, 3-4 times a day.  Keep your leg raised (elevated) when possible to lessen swelling.  Avoid activities that cause pain.  Follow specific exercises as directed by your health care provider.  Sleep with a pillow between your legs on your most comfortable side.  Record how often you have hip pain, the location of the pain, and what it feels like. SEEK MEDICAL CARE IF:   You are unable to put weight on your leg.  Your hip is red or swollen or very tender to touch.  Your pain or swelling continues or worsens after 1 week.  You have increasing difficulty walking.  You have a fever. SEEK IMMEDIATE MEDICAL CARE IF:   You have fallen.  You have a sudden increase in pain and swelling in your hip. MAKE SURE YOU:   Understand these instructions.  Will watch your condition.  Will get help right away if you are not doing well or get worse. Document Released: 03/25/2010 Document Revised:  02/19/2014 Document Reviewed: 06/01/2013 [employer] Patient Information 2015 [place], [place]. This information is not intended to replace advice given to you by your health care provider. Make sure you discuss any questions you have with your health care provider.  Knee Sprain A knee sprain is a tear in one of the strong, fibrous tissues that connect the bones (ligaments) in your knee. The severity of the sprain depends on how much of the ligament is torn. The tear can be either partial or complete. CAUSES  Often, sprains are a result of a fall or injury. The force of the impact causes the fibers of your ligament to stretch too much. This excess  tension causes the fibers of your ligament to tear. SIGNS AND SYMPTOMS  You may have some loss of motion in your knee. Other symptoms include:  Bruising.  Pain in the knee area.  Tenderness of the knee to the touch.  Swelling. DIAGNOSIS  To diagnose a knee sprain, your health care provider will physically examine your knee. Your health care provider may also suggest an X-ray exam of your knee to make sure no bones are broken. TREATMENT  If your ligament is only partially torn, treatment usually involves keeping the knee in a fixed position (immobilization) or bracing your knee for activities that require movement for several weeks. To do this, your health care provider will apply a bandage, cast, or splint to keep your knee from moving and to support your knee during movement until it heals. For a partially torn ligament, the healing process usually takes 4-6 weeks. If your ligament is completely torn, depending on which ligament it is, you may need surgery to reconnect the ligament to the bone or reconstruct it. After surgery, a cast or splint may be applied and will need to stay on your knee for 4-6 weeks while your ligament heals. HOME CARE INSTRUCTIONS  Keep your injured knee elevated to decrease swelling.  To ease pain and swelling, apply ice to the injured area:  Put ice in a plastic bag.  Place a towel between your skin and the bag.  Leave the ice on for 20 minutes, 2-3 times a day.  Only take medicine for pain as directed by your health care provider.  Do not leave your knee unprotected until pain and stiffness go away (usually 4-6 weeks).  If you have a cast or splint, do not allow it to get wet. If you have been instructed not to remove it, cover it with a plastic bag when you shower or bathe. Do not swim.  Your health care provider may suggest exercises for you to do during your recovery to prevent or limit permanent weakness and stiffness. SEEK IMMEDIATE MEDICAL CARE  IF:  Your cast or splint becomes damaged.  Your pain becomes worse.  You have significant pain, swelling, or numbness below the cast or splint. MAKE SURE YOU:  Understand these instructions.  Will watch your condition.  Will get help right away if you are not doing well or get worse. Document Released: 10/05/2005 Document Revised: 07/26/2013 Document Reviewed: 05/17/2013 [facility] Patient Information 2015 [place], [place]. This information is not intended to replace advice given to you by your health care provider. Make sure you discuss any questions you have with your health care provider.  Arthritis, Nonspecific Arthritis is inflammation of a joint. This usually means pain, redness, warmth or swelling are present. One or more joints may be involved. There are a number of types of arthritis. Your caregiver  may not be able to tell what type of arthritis you have right away. CAUSES  The most common cause of arthritis is the wear and tear on the joint (osteoarthritis). This causes damage to the cartilage, which can break down over time. The knees, hips, back and neck are most often affected by this type of arthritis. Other types of arthritis and common causes of joint pain include:  Sprains and other injuries near the joint. Sometimes minor sprains and injuries cause pain and swelling that develop hours later.  Rheumatoid arthritis. This affects hands, feet and knees. It usually affects both sides of your body at the same time. It is often associated with chronic ailments, fever, weight loss and general weakness.  Crystal arthritis. Gout and pseudo gout can cause occasional acute severe pain, redness and swelling in the foot, ankle, or knee.  Infectious arthritis. Bacteria can get into a joint through a break in overlying skin. This can cause infection of the joint. Bacteria and viruses can also spread through the blood and affect your joints.  Drug, infectious and allergy reactions.  Sometimes joints can become mildly painful and slightly swollen with these types of illnesses. SYMPTOMS   Pain is the main symptom.  Your joint or joints can also be red, swollen and warm or hot to the touch.  You may have a fever with certain types of arthritis, or even feel overall ill.  The joint with arthritis will hurt with movement. Stiffness is present with some types of arthritis. DIAGNOSIS  Your caregiver will suspect arthritis based on your description of your symptoms and on your exam. Testing may be needed to find the type of arthritis:  Blood and sometimes urine tests.  X-ray tests and sometimes CT or MRI scans.  Removal of fluid from the joint (arthrocentesis) is done to check for bacteria, crystals or other causes. Your caregiver (or a specialist) will numb the area over the joint with a local anesthetic, and use a needle to remove joint fluid for examination. This procedure is only minimally uncomfortable.  Even with these tests, your caregiver may not be able to tell what kind of arthritis you have. Consultation with a specialist (rheumatologist) may be helpful. TREATMENT  Your caregiver will discuss with you treatment specific to your type of arthritis. If the specific type cannot be determined, then the following general recommendations may apply. Treatment of severe joint pain includes:  Rest.  Elevation.  Anti-inflammatory medication (for example, ibuprofen) may be prescribed. Avoiding activities that cause increased pain.  Only take over-the-counter or prescription medicines for pain and discomfort as recommended by your caregiver.  Cold packs over an inflamed joint may be used for 10 to 15 minutes every hour. Hot packs sometimes feel better, but do not use overnight. Do not use hot packs if you are diabetic without your caregiver's permission.  A cortisone shot into arthritic joints may help reduce pain and swelling.  Any acute arthritis that gets worse over  the next 1 to 2 days needs to be looked at to be sure there is no joint infection. Long-term arthritis treatment involves modifying activities and lifestyle to reduce joint stress jarring. This can include weight loss. Also, exercise is needed to nourish the joint cartilage and remove waste. This helps keep the muscles around the joint strong. HOME CARE INSTRUCTIONS   Do not take aspirin to relieve pain if gout is suspected. This elevates uric acid levels.  Only take over-the-counter or prescription medicines for pain,  discomfort or fever as directed by your caregiver.  Rest the joint as much as possible.  If your joint is swollen, keep it elevated.  Use crutches if the painful joint is in your leg.  Drinking plenty of fluids may help for certain types of arthritis.  Follow your caregiver's dietary instructions.  Try low-impact exercise such as:  Swimming.  Water aerobics.  Biking.  Walking.  Morning stiffness is often relieved by a warm shower.  Put your joints through regular range-of-motion. SEEK MEDICAL CARE IF:   You do not feel better in 24 hours or are getting worse.  You have side effects to medications, or are not getting better with treatment. SEEK IMMEDIATE MEDICAL CARE IF:   You have a fever.  You develop severe joint pain, swelling or redness.  Many joints are involved and become painful and swollen.  There is severe back pain and/or leg weakness.  You have loss of bowel or bladder control. Document Released: 11/12/2004 Document Revised: 12/28/2011 Document Reviewed: 11/28/2008 [facility] Patient Information 2015 [place], [place]. This information is not intended to replace advice given to you by your health care provider. Make sure you discuss any questions you have with your health care provider.

## 2015-02-10 ENCOUNTER — Other Ambulatory Visit: Payer: Self-pay | Admitting: Family Medicine

## 2015-02-13 ENCOUNTER — Other Ambulatory Visit: Payer: Self-pay | Admitting: Family Medicine

## 2015-02-22 ENCOUNTER — Ambulatory Visit: Payer: Commercial Managed Care - PPO | Admitting: Family Medicine

## 2015-04-05 ENCOUNTER — Ambulatory Visit: Payer: Commercial Managed Care - PPO | Admitting: Internal Medicine

## 2015-04-09 ENCOUNTER — Ambulatory Visit (INDEPENDENT_AMBULATORY_CARE_PROVIDER_SITE_OTHER): Payer: Commercial Managed Care - PPO | Admitting: Family Medicine

## 2015-04-09 ENCOUNTER — Encounter: Payer: Self-pay | Admitting: Family Medicine

## 2015-04-09 VITALS — BP 112/73 | HR 81 | Temp 98.9°F | Ht 68.5 in | Wt 286.0 lb

## 2015-04-09 DIAGNOSIS — J209 Acute bronchitis, unspecified: Secondary | ICD-10-CM

## 2015-04-09 MED ORDER — CLARITHROMYCIN 500 MG PO TABS: 500.0000 mg | ORAL_TABLET | Freq: Two times a day (BID) | ORAL | Status: DC

## 2015-04-09 MED ORDER — SIMVASTATIN 20 MG PO TABS: 20.0000 mg | ORAL_TABLET | Freq: Every day | ORAL | Status: DC

## 2015-04-09 MED ORDER — HYDROCODONE-HOMATROPINE 5-1.5 MG/5ML PO SYRP: 5.0000 mL | ORAL_SOLUTION | ORAL | Status: DC | PRN

## 2015-04-09 NOTE — Progress Notes (Signed)
Pre visit review using our clinic review tool, if applicable. No additional management support is needed unless otherwise documented below in the visit note. 

## 2015-04-10 ENCOUNTER — Encounter: Payer: Self-pay | Admitting: Family Medicine

## 2015-04-10 NOTE — Progress Notes (Signed)
   Subjective:    Patient ID: Jim Green, male    DOB: 1961/02/28, 54 y.o.   MRN: [id number]  HPI Here for 6 weeks of intermittent chest congestion and coughing up green sputum. No fever. He has tried Robitussin with no effect. No one else in the family is sick.    Review of Systems  Constitutional: Negative.   HENT: Positive for congestion. Negative for postnasal drip and sinus pressure.   Eyes: Negative.   Respiratory: Positive for cough, chest tightness and wheezing. Negative for shortness of breath.   Cardiovascular: Negative.        Objective:   Physical Exam  Constitutional: He appears well-developed and well-nourished.  HENT:  Right Ear: External ear normal.  Left Ear: External ear normal.  Nose: Nose normal.  Mouth/Throat: Oropharynx is clear and moist.  Eyes: Conjunctivae are normal.  Cardiovascular: Normal rate, regular rhythm, normal heart sounds and intact distal pulses.   Pulmonary/Chest: Effort normal. No respiratory distress. He has no rales.  Scattered wheezes and rhonchi  Lymphadenopathy:    He has no cervical adenopathy.          Assessment & Plan:  Bronchitis. Treat with Biaxin. Add MUcinex

## 2015-05-12 ENCOUNTER — Other Ambulatory Visit: Payer: Self-pay | Admitting: Family Medicine

## 2015-07-01 ENCOUNTER — Telehealth: Payer: Self-pay | Admitting: Family Medicine

## 2015-07-01 NOTE — Telephone Encounter (Signed)
Okay to work in at the end of the day

## 2015-07-01 NOTE — Telephone Encounter (Signed)
Pt states he cannot do 8am. Would end of the day after 4pm work on tues? Pt is going to try and work tomorrow

## 2015-07-01 NOTE — Telephone Encounter (Signed)
Please schedule tomorrow at 8 am

## 2015-07-01 NOTE — Telephone Encounter (Signed)
Pt has been scheduled.  °

## 2015-07-01 NOTE — Telephone Encounter (Signed)
Pt has been having HA on and off for a while, but woke up this am very dizzy, room spinning.  Pt did not work. Would like to see dr todd today or afternoon tomorrow for this issue. pls advise if ok to schedule or see another provider.

## 2015-07-02 ENCOUNTER — Other Ambulatory Visit: Payer: Self-pay | Admitting: Family Medicine

## 2015-07-02 ENCOUNTER — Encounter: Payer: Self-pay | Admitting: Family Medicine

## 2015-07-02 ENCOUNTER — Ambulatory Visit (INDEPENDENT_AMBULATORY_CARE_PROVIDER_SITE_OTHER): Payer: Commercial Managed Care - PPO | Admitting: Family Medicine

## 2015-07-02 VITALS — BP 140/90 | Temp 98.7°F | Wt 293.0 lb

## 2015-07-02 DIAGNOSIS — H8111 Benign paroxysmal vertigo, right ear: Secondary | ICD-10-CM | POA: Diagnosis not present

## 2015-07-02 DIAGNOSIS — H8110 Benign paroxysmal vertigo, unspecified ear: Secondary | ICD-10-CM | POA: Insufficient documentation

## 2015-07-02 DIAGNOSIS — E119 Type 2 diabetes mellitus without complications: Secondary | ICD-10-CM

## 2015-07-02 MED ORDER — LISINOPRIL-HYDROCHLOROTHIAZIDE 20-25 MG PO TABS: ORAL_TABLET | ORAL | Status: DC

## 2015-07-02 NOTE — Progress Notes (Signed)
Pre visit review using our clinic review tool, if applicable. No additional management support is needed unless otherwise documented below in the visit note.  Influenza immunization was not given due to patient gets flu vaccine at work.

## 2015-07-02 NOTE — Patient Instructions (Signed)
Continue current medication  Set up a time to see [employer]........Marland Kitchen our new adult nurse practitioner sometime in the next month to 6 weeks for complete physical examination  Fasting labs one week prior

## 2015-07-02 NOTE — Progress Notes (Signed)
   Subjective:    Patient ID: Jim Green, male    DOB: 16-Feb-1961, 54 y.o.   MRN: [id number]  HPI [name] is a 54 year old married male nonsmoker who comes in today for evaluation of vertigo  He states he woke up yesterday morning profoundly dizzy. He try to go to work but was too difficult driving. He went home and laid down on his left side in the vertigo was worse. On his right side the vertigo stopped. He slept about 2 in the afternoon woke up and the vertigo was about 100% gone. He still felt a little imbalance throughout the day.  No history of headache numbness weakness etc. etc. He's never had vertigo in the past.  He does have diabetes on metformin 500 mg daily. He doesn't check his blood sugar. BP 140/90 on Zestoretic 20-25 daily. He's overdue for a physical evaluation    Review of Systems Review of systems otherwise negative    Objective:   Physical Exam Well-developed well-nourished male no acute distress vital signs stable he is afebrile HEENT were negative neuro exam negative       Assessment & Plan:  Benign positional vertigo,,,,, reassured  Diabetes type 2,,,,,,,,, due for CPX this fall

## 2015-09-02 ENCOUNTER — Other Ambulatory Visit: Payer: Self-pay | Admitting: *Deleted

## 2015-09-02 MED ORDER — METFORMIN HCL 500 MG PO TABS: ORAL_TABLET | ORAL | Status: DC

## 2015-09-05 ENCOUNTER — Other Ambulatory Visit: Payer: Self-pay | Admitting: Family Medicine

## 2015-11-29 ENCOUNTER — Encounter: Payer: Self-pay | Admitting: Gastroenterology

## 2016-03-30 ENCOUNTER — Encounter: Payer: Self-pay | Admitting: Family Medicine

## 2016-03-30 ENCOUNTER — Ambulatory Visit (INDEPENDENT_AMBULATORY_CARE_PROVIDER_SITE_OTHER): Payer: Commercial Managed Care - PPO | Admitting: Family Medicine

## 2016-03-30 VITALS — BP 138/92 | HR 86 | Temp 99.0°F | Resp 12 | Ht 68.5 in | Wt 297.2 lb

## 2016-03-30 DIAGNOSIS — Z6841 Body Mass Index (BMI) 40.0 and over, adult: Secondary | ICD-10-CM | POA: Diagnosis not present

## 2016-03-30 DIAGNOSIS — M174 Other bilateral secondary osteoarthritis of knee: Secondary | ICD-10-CM

## 2016-03-30 DIAGNOSIS — E1165 Type 2 diabetes mellitus with hyperglycemia: Secondary | ICD-10-CM | POA: Diagnosis not present

## 2016-03-30 DIAGNOSIS — E11618 Type 2 diabetes mellitus with other diabetic arthropathy: Secondary | ICD-10-CM | POA: Diagnosis not present

## 2016-03-30 DIAGNOSIS — I10 Essential (primary) hypertension: Secondary | ICD-10-CM | POA: Diagnosis not present

## 2016-03-30 DIAGNOSIS — M170 Bilateral primary osteoarthritis of knee: Secondary | ICD-10-CM | POA: Insufficient documentation

## 2016-03-30 DIAGNOSIS — IMO0002 Reserved for concepts with insufficient information to code with codable children: Secondary | ICD-10-CM

## 2016-03-30 MED ORDER — TRAMADOL HCL 50 MG PO TABS: 50.0000 mg | ORAL_TABLET | Freq: Three times a day (TID) | ORAL | Status: AC | PRN

## 2016-03-30 MED ORDER — DICLOFENAC SODIUM 1 % TD GEL: 4.0000 g | Freq: Four times a day (QID) | TRANSDERMAL | Status: DC

## 2016-03-30 NOTE — Patient Instructions (Addendum)
A few things to remember from today's visit:   1. BMI 40.0-44.9, adult ([place])   2. Essential hypertension  - Basic metabolic panel - Hemoglobin A1c  3. Other secondary osteoarthritis of both knees  - Ambulatory referral to Orthopedic Surgery  4. 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 Recommending checking blood pressure at home. Low salt diet.  Weight loss will help with knee OA, [place]. Ortho referral placed.  Follow up in 2 months. If you sign-up for My chart, you can communicate easier with [place] in case you have any question or concern.

## 2016-03-30 NOTE — Progress Notes (Signed)
HPI:   Mr. Jim Green is a 55 y.o. male, who is here today complaining of bilateral knee pain, left getting worse.   He has had bilateral knee pain for "a good while", left seems to be getting worse for the past month.  No Hx of trauma.  Quality: achy/sharp.  Exacerbated by prolonged standing and walking, going up/down stairs, squatting, and bending. It is alleviated by rest. In general 8/10, intermittently but can be "over 10/10." Associated symptoms: knee effusion and stiffness, the latter one is worse with prolonged rest and better with movement.  Current treatment: Ibuprofen, helped little, so stopped it. He has had right arthroscopy a few years ago and did PT. Knee pain is general getting worse. He is concerned about "blood clot."   11/2014 left knee X ray: No acute osseous findings.  Tricompartmental degenerative changes.  His BP is elevated today. Hypertension: Currently he is on Lisinopril-HCTZ 20-25 mg daily. He states that he is taking medications as instructed, no side effects reported.  He does not check BP at home. He does not follow any particular diet and does not exercise regularly.  He has not noted unusual headache, visual changes, exertional chest pain, dyspnea,  focal weakness, or worsening edema.  Lab Results  Component Value Date   CREATININE 1.0 01/01/2014   BUN 17 01/01/2014   NA 137 01/01/2014   K 3.8 01/01/2014   CL 100 01/01/2014   CO2 32 01/01/2014    Diabetes Mellitus II: Heis currently on Metformin 500 mg daily. Problem has been stable, has not followed sine 2015. He is not checking BS's, denies any hypoglycemia like symptom. He is tolerating medications well. He denies abdominal pain, nausea, vomiting, polydipsia, polyuria, or polyphagia. No numbness, mild intermittent tingling and burning on medial aspect of knees.     Lab Results  Component Value Date   CREATININE 1.0 01/01/2014   BUN 17 01/01/2014   NA 137  01/01/2014   K 3.8 01/01/2014   CL 100 01/01/2014   CO2 32 01/01/2014    Lab Results  Component Value Date   HGBA1C 6.7* 01/01/2014   Lab Results  Component Value Date   MICROALBUR 0.2 01/01/2014     Review of Systems  Constitutional: Negative for fever, appetite change, fatigue and unexpected weight change.  HENT: Negative for nosebleeds, sore throat and trouble swallowing.   Eyes: Negative for redness and visual disturbance.  Respiratory: Negative for apnea, cough, shortness of breath and wheezing.   Cardiovascular: Positive for leg swelling (intermitently, usually after prolonged standing). Negative for chest pain and palpitations.  Gastrointestinal: Negative for nausea, vomiting and abdominal pain.       No changes in bowel habits.  Endocrine: Negative for polydipsia, polyphagia and polyuria.  Genitourinary: Negative for dysuria, hematuria and decreased urine volume.  Musculoskeletal: Positive for joint swelling and arthralgias.  Skin: Negative for color change and rash.  Neurological: Negative for dizziness, seizures, syncope, weakness, numbness and headaches.  Psychiatric/Behavioral: Negative for behavioral problems and confusion. The patient is not nervous/anxious.       Current Outpatient Prescriptions on File Prior to Visit  Medication Sig Dispense Refill  . lisinopril-hydrochlorothiazide (PRINZIDE,ZESTORETIC) 20-25 MG tablet TAKE ONE TABLET BY MOUTH ONCE DAILY**NEED OFFICE VISIT FOR MORE REFILLS** 90 tablet 1  . metFORMIN (GLUCOPHAGE) 500 MG tablet TAKE ONE TABLET BY MOUTH ONCE DAILY WITH BREAKFAST 90 tablet 1  . simvastatin (ZOCOR) 20 MG tablet Take 1 tablet (20 mg total) by  mouth at bedtime. 30 tablet 5   No current facility-administered medications on file prior to visit.     Past Medical History  Diagnosis Date  . Hypertension   . Obesity   . Eczema   . Allergy   . ED (erectile dysfunction)   . Hyperlipidemia   . Sleep apnea    No Known  Allergies  Social History   Social History  . Marital Status: Single    Spouse Name: N/A  . Number of Children: N/A  . Years of Education: N/A   Social History Main Topics  . Smoking status: Never Smoker   . Smokeless tobacco: Never Used  . Alcohol Use: 2.4 oz/week    4 Standard drinks or equivalent per week  . Drug Use: No  . Sexual Activity: Not Asked   Other Topics Concern  . None   Social History Narrative    Filed Vitals:   03/30/16 1607  BP: 138/92  Pulse: 86  Temp: 99 F (37.2 C)  Resp: 12   Body mass index is 44.53 kg/(m^2).    Physical Exam  Constitutional: He is oriented to person, place, and time. He appears well-developed. He does not appear ill. No distress.  HENT:  Head: Atraumatic.  Mouth/Throat: Oropharynx is clear and moist and mucous membranes are normal.  Eyes: Conjunctivae and EOM are normal. Pupils are equal, round, and reactive to light. No scleral icterus.  Neck: No muscular tenderness present.  Cardiovascular: Normal rate and regular rhythm.   No murmur heard. Pulses:      Dorsalis pedis pulses are 2+ on the right side, and 2+ on the left side.  Respiratory: Effort normal and breath sounds normal. No respiratory distress.  GI: Soft. He exhibits no mass. There is no hepatomegaly. There is no tenderness.  Musculoskeletal: He exhibits edema (Trace pitting edema LE bilateral.). He exhibits no tenderness.       Right lower leg: He exhibits no tenderness.       Left lower leg: He exhibits no tenderness.  Knee: on inspection mild effusion bilateral, no erythema or deformities. Valgus and varus stress normal, elicits pain medially (R>L). Anterior and posterior drawer test negative. Otherwise normal ROM, crepitus bilateral, moderate, L>R    Lymphadenopathy:    He has no cervical adenopathy.  Neurological: He is alert and oriented to person, place, and time. He has normal strength. Gait normal.  Skin: Skin is warm. No erythema.  Psychiatric:  He has a normal mood and affect.  Well groomed, good eye contact.      ASSESSMENT AND PLAN:     Jim Green was seen today for knee pain.  Diagnoses and all orders for this visit:  Other secondary osteoarthritis of both knees  Discussed a few treatment options. Given his history of diabetes I don't recommend intra-articular steroid injections, which he has had in the right knee. 4. Referral placed to discuss other intra-articular medications from which he may benefit. Jim Green may help as well as weight loss. I don't recommend NSAIDs given   his elevated blood pressure, tramadol prescription was given today, some side effects discussed. I do not think symptoms and physical findings are suggested of DVT, reassured.  -     diclofenac sodium (VOLTAREN) 1 % GEL; Apply 4 g topically 4 (four) times daily. -     Ambulatory referral to Orthopedic Surgery -     traMADol (ULTRAM) 50 MG tablet; Take 1 tablet (50 mg total) by mouth every 8 (  eight) hours as needed.  BMI 40.0-44.9, adult ([place])  We discussed benefits of wt loss as well as adverse effects of obesity. Consistency with healthy diet and physical activity recommended. Portion controlled is a good start + daily brisk walking for 15-30 min as tolerated.   Essential hypertension  Re-checked 150/85, not well controlled. Possible complications of elevated BP discussed. Annual eye examination encouraged. F/U in 8 weeks.   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)  ? Neuropathy. Foot and eye exam recommended. No changes in current management, will follow labs done today and will give further recommendations accordingly.   -     Hemoglobin A1c             -He was advised to return or notify a doctor immediately if symptoms worsen or persist or new concerns arise.       Ersel Wadleigh G.  [place], MD  [facility]. [place] office.

## 2016-03-30 NOTE — Progress Notes (Signed)
Pre visit review using our clinic review tool, if applicable. No additional management support is needed unless otherwise documented below in the visit note. 

## 2016-03-31 LAB — BASIC METABOLIC PANEL
BUN: 19 mg/dL (ref 6–23)
CO2: 28 meq/L (ref 19–32)
Calcium: 9 mg/dL (ref 8.4–10.5)
Chloride: 99 mEq/L (ref 96–112)
Creatinine, Ser: 0.96 mg/dL (ref 0.40–1.50)
GFR: 103.86 mL/min (ref >60.00)
GLUCOSE: 166 mg/dL — AB (ref 70–99)
POTASSIUM: 3.3 meq/L — AB (ref 3.5–5.1)
SODIUM: 135 meq/L (ref 135–145)

## 2016-03-31 LAB — HEMOGLOBIN A1C: Hgb A1c MFr Bld: 6.6 % — ABNORMAL HIGH (ref 4.6–6.5)

## 2016-04-03 ENCOUNTER — Telehealth: Payer: Self-pay | Admitting: Family Medicine

## 2016-04-03 NOTE — Telephone Encounter (Signed)
Pt informed of results and verbalized understanding.  

## 2016-04-03 NOTE — Telephone Encounter (Signed)
Pt returning your call. Please call back °

## 2016-04-04 ENCOUNTER — Other Ambulatory Visit: Payer: Self-pay | Admitting: Family Medicine

## 2016-04-09 ENCOUNTER — Other Ambulatory Visit: Payer: Self-pay | Admitting: Family Medicine

## 2016-04-19 ENCOUNTER — Other Ambulatory Visit: Payer: Self-pay | Admitting: Family Medicine

## 2016-04-23 ENCOUNTER — Telehealth: Payer: Self-pay | Admitting: *Deleted

## 2016-04-23 NOTE — Telephone Encounter (Signed)
Prior Authorization submitted today for Diclofenac Sodium.

## 2016-05-25 ENCOUNTER — Ambulatory Visit: Payer: Commercial Managed Care - PPO | Admitting: Family Medicine

## 2016-08-06 ENCOUNTER — Other Ambulatory Visit: Payer: Self-pay | Admitting: Family Medicine

## 2016-09-09 ENCOUNTER — Other Ambulatory Visit: Payer: Self-pay | Admitting: Family Medicine

## 2016-09-29 ENCOUNTER — Encounter: Payer: Self-pay | Admitting: Family Medicine

## 2016-09-29 ENCOUNTER — Ambulatory Visit (INDEPENDENT_AMBULATORY_CARE_PROVIDER_SITE_OTHER): Payer: Commercial Managed Care - PPO | Admitting: Family Medicine

## 2016-09-29 DIAGNOSIS — R1030 Lower abdominal pain, unspecified: Secondary | ICD-10-CM | POA: Diagnosis not present

## 2016-09-29 DIAGNOSIS — R109 Unspecified abdominal pain: Secondary | ICD-10-CM | POA: Insufficient documentation

## 2016-09-29 MED ORDER — HYOSCYAMINE SULFATE ER 0.375 MG PO TB12: 0.3750 mg | ORAL_TABLET | Freq: Two times a day (BID) | ORAL | 9 refills | Status: DC

## 2016-09-29 NOTE — Patient Instructions (Signed)
Carbohydrate and lactose free diet  Levbid.......... one twice daily when necessary  Drink 3-4 glasses of water daily  Milk of magnesia or prune juice daily  GI consult ASAP for further evaluation

## 2016-09-29 NOTE — Progress Notes (Signed)
Jim Green is a 55 year old male nonsmoker who comes in today for evaluation of abdominal pain 3-4 months. It's extremely difficult to get a coherent history.  He says the pain started last week then he said it started 2 weeks ago and then he said it started 3 months ago. He points to both the lower quadrants of his abdomen is a source of his discomfort. He says the discomfort comes and goes may last for a few seconds and may last for 20 minutes. He has no fever chills nausea vomiting diarrhea weight loss or urinary tract symptoms. The pain is not related to eating. He states he had a loose [place] on Saturday and hasn't had a bowel movement since. He had a colonoscopy 6 years ago which was normal. He says he has a lot of gas.  14 point review of systems otherwise negative  Physical examination.vs BP (!) 180/90 (BP Location: Left Arm, Patient Position: Sitting, Cuff Size: Large)   Pulse 83   Temp 98.2 F (36.8 C) (Oral)   Ht 5\' 8"  (1.727 m)   Wt 293 lb 8 oz (133.1 kg)   BMI 44.63 kg/m  Well-developed well-nourished male in no acute distress in the supine position he is a fairly large panniculus. Bowel sounds are normal liver spleen kidney not enlarged I can appreciate no masses no tenderness. Rectal examination rectum normal stool guaiac-negative prostate normal .........Marland Kitchen. minimal stool in the rectum  Impression 3 months history of abdominal pain,,,,,,, lactose free diet,,,,,,,, GI consult for further evaluation

## 2016-09-29 NOTE — Progress Notes (Signed)
Pre visit review using our clinic review tool, if applicable. No additional management support is needed unless otherwise documented below in the visit note. 

## 2016-10-14 ENCOUNTER — Other Ambulatory Visit: Payer: Self-pay | Admitting: Family Medicine

## 2016-10-30 ENCOUNTER — Ambulatory Visit: Payer: Commercial Managed Care - PPO | Admitting: Adult Health

## 2016-11-02 ENCOUNTER — Encounter: Payer: Self-pay | Admitting: Gastroenterology

## 2016-11-18 ENCOUNTER — Other Ambulatory Visit: Payer: Self-pay | Admitting: Family Medicine

## 2016-12-11 ENCOUNTER — Ambulatory Visit: Payer: Commercial Managed Care - PPO | Admitting: Gastroenterology

## 2016-12-15 ENCOUNTER — Encounter: Payer: Self-pay | Admitting: Family Medicine

## 2017-05-12 ENCOUNTER — Ambulatory Visit (INDEPENDENT_AMBULATORY_CARE_PROVIDER_SITE_OTHER): Payer: Commercial Managed Care - PPO | Admitting: Adult Health

## 2017-05-12 ENCOUNTER — Encounter: Payer: Self-pay | Admitting: Adult Health

## 2017-05-12 VITALS — BP 158/82 | HR 84 | Temp 98.3°F | Ht 68.0 in | Wt 286.0 lb

## 2017-05-12 DIAGNOSIS — T148XXA Other injury of unspecified body region, initial encounter: Secondary | ICD-10-CM

## 2017-05-12 MED ORDER — LISINOPRIL-HYDROCHLOROTHIAZIDE 20-25 MG PO TABS: ORAL_TABLET | ORAL | 2 refills | Status: DC

## 2017-05-12 MED ORDER — CYCLOBENZAPRINE HCL 10 MG PO TABS: 10.0000 mg | ORAL_TABLET | Freq: Three times a day (TID) | ORAL | 0 refills | Status: DC | PRN

## 2017-05-12 NOTE — Patient Instructions (Signed)
It was great meeting you today   Your exam is consistent with a muscle strain. I have sent in Flexeril as the muscle relaxer. Also take 600mg  of Motrin and use a heating pad   Follow up if no improvement in the next week

## 2017-05-12 NOTE — Progress Notes (Signed)
Subjective:    Patient ID: Jim Green, male    DOB: December 25, 1960, 56 y.o.   MRN: [id number]  HPI  56 year old male who  has a past medical history of Allergy; Eczema; ED (erectile dysfunction); Hyperlipidemia; Hypertension; Obesity; and Sleep apnea. He presents to the office today with the complaint of left leg pain x 5 days. He reports pain in his hamstring. He denies any trauma or aggravating factors. Denies any redness, warmth or swelling. He has not been using any OTC pain medications. Has found that stretching helps with the pain. Pain is worse when walking   Review of Systems See HPI   Past Medical History:  Diagnosis Date  . Allergy   . Eczema   . ED (erectile dysfunction)   . Hyperlipidemia   . Hypertension   . Obesity   . Sleep apnea     Social History   Social History  . Marital status: Single    Spouse name: N/A  . Number of children: N/A  . Years of education: N/A   Occupational History  . Not on file.   Social History Main Topics  . Smoking status: Never Smoker  . Smokeless tobacco: Never Used  . Alcohol use 2.4 oz/week    4 Standard drinks or equivalent per week  . Drug use: No  . Sexual activity: Not on file   Other Topics Concern  . Not on file   Social History Narrative  . No narrative on file    Past Surgical History:  Procedure Laterality Date  . KNEE ARTHROSCOPY     right knee    Family History  Problem Relation Age of Onset  . Heart disease Unknown   . Hypertension Unknown   . Obesity Unknown     No Known Allergies  Current Outpatient Prescriptions on File Prior to Visit  Medication Sig Dispense Refill  . metFORMIN (GLUCOPHAGE) 500 MG tablet TAKE ONE TABLET BY MOUTH ONCE DAILY WITH  BREAKFAST 90 tablet 3  . simvastatin (ZOCOR) 20 MG tablet TAKE ONE TABLET BY MOUTH ONCE DAILY AT BEDTIME 90 tablet 3   No current facility-administered medications on file prior to visit.     BP (!) 158/82 (BP Location: Left Arm, Patient  Position: Sitting, Cuff Size: Large)   Pulse 84   Temp 98.3 F (36.8 C) (Oral)   Ht 5\' 8"  (1.727 m)   Wt 286 lb (129.7 kg)   SpO2 99%   BMI 43.49 kg/m       Objective:   Physical Exam  Constitutional: He is oriented to person, place, and time. He appears well-developed and well-nourished. No distress.  Cardiovascular: Normal rate, regular rhythm, normal heart sounds and intact distal pulses.  Exam reveals no gallop and no friction rub.   No murmur heard. Pulmonary/Chest: Effort normal and breath sounds normal. No respiratory distress. He has no wheezes. He has no rales. He exhibits no tenderness.  Musculoskeletal: Normal range of motion. He exhibits no edema, tenderness or deformity.  Bilateral calfs 43 cm in diameter.  No tenderness to calf noted.  Does have tenderness with deep palpitation to left hamstring   Neurological: He is alert and oriented to person, place, and time.  Skin: Skin is warm and dry. No rash noted. He is not diaphoretic. No erythema. No pallor.  No redness or warmth noted   Psychiatric: He has a normal mood and affect. His behavior is normal. Judgment normal.  Nursing note and vitals reviewed.  Assessment & Plan:  1. Muscle strain - Appears to be strain injury. No signs of DVT. Advised Motrin 600mg , heating pad, stretching exercises and rest. Cna use Flexeril as needed.  - cyclobenzaprine (FLEXERIL) 10 MG tablet; Take 1 tablet (10 mg total) by mouth 3 (three) times daily as needed for muscle spasms.  Dispense: 30 tablet; Refill: 0 - Follow up if no improvement in a week  - Follow up sooner if symptoms worsen or there is redness, warmth or swelling  Shirline Freesory Bhavesh Vazquez, NP

## 2017-06-01 ENCOUNTER — Ambulatory Visit: Payer: Commercial Managed Care - PPO | Admitting: Adult Health

## 2017-06-01 DIAGNOSIS — Z0289 Encounter for other administrative examinations: Secondary | ICD-10-CM

## 2017-06-22 ENCOUNTER — Ambulatory Visit: Payer: Commercial Managed Care - PPO | Admitting: Adult Health

## 2017-07-08 ENCOUNTER — Ambulatory Visit: Payer: Commercial Managed Care - PPO | Admitting: Adult Health

## 2017-10-07 ENCOUNTER — Other Ambulatory Visit: Payer: Self-pay | Admitting: Family Medicine

## 2018-06-03 ENCOUNTER — Other Ambulatory Visit: Payer: Self-pay | Admitting: Adult Health

## 2018-06-12 ENCOUNTER — Emergency Department (HOSPITAL_COMMUNITY): Payer: Commercial Managed Care - PPO

## 2018-06-12 ENCOUNTER — Emergency Department (HOSPITAL_COMMUNITY)
Admission: EM | Admit: 2018-06-12 | Discharge: 2018-06-12 | Disposition: A | Discharge status: Home / Self Care | Payer: Commercial Managed Care - PPO | Attending: Emergency Medicine | Admitting: Emergency Medicine

## 2018-06-12 ENCOUNTER — Encounter (HOSPITAL_COMMUNITY): Payer: Self-pay | Admitting: Emergency Medicine

## 2018-06-12 ENCOUNTER — Encounter (HOSPITAL_COMMUNITY): Payer: Self-pay | Admitting: *Deleted

## 2018-06-12 ENCOUNTER — Ambulatory Visit (HOSPITAL_COMMUNITY)
Admission: EM | Admit: 2018-06-12 | Discharge: 2018-06-12 | Disposition: A | Discharge status: Home / Self Care | Payer: Commercial Managed Care - PPO | Source: Home / Self Care

## 2018-06-12 ENCOUNTER — Other Ambulatory Visit: Payer: Self-pay

## 2018-06-12 DIAGNOSIS — R0789 Other chest pain: Secondary | ICD-10-CM | POA: Diagnosis not present

## 2018-06-12 DIAGNOSIS — R079 Chest pain, unspecified: Secondary | ICD-10-CM | POA: Insufficient documentation

## 2018-06-12 DIAGNOSIS — Z7984 Long term (current) use of oral hypoglycemic drugs: Secondary | ICD-10-CM | POA: Diagnosis not present

## 2018-06-12 DIAGNOSIS — E119 Type 2 diabetes mellitus without complications: Secondary | ICD-10-CM | POA: Diagnosis not present

## 2018-06-12 DIAGNOSIS — R0602 Shortness of breath: Secondary | ICD-10-CM | POA: Diagnosis not present

## 2018-06-12 DIAGNOSIS — Z79899 Other long term (current) drug therapy: Secondary | ICD-10-CM | POA: Diagnosis not present

## 2018-06-12 DIAGNOSIS — I10 Essential (primary) hypertension: Secondary | ICD-10-CM | POA: Insufficient documentation

## 2018-06-12 DIAGNOSIS — R0600 Dyspnea, unspecified: Secondary | ICD-10-CM | POA: Diagnosis not present

## 2018-06-12 HISTORY — DX: Type 2 diabetes mellitus without complications: E11.9

## 2018-06-12 LAB — BASIC METABOLIC PANEL
ANION GAP: 9 (ref 5–15)
BUN: 15 mg/dL (ref 6–20)
CO2: 26 mmol/L (ref 22–32)
Calcium: 9.1 mg/dL (ref 8.9–10.3)
Chloride: 102 mmol/L (ref 98–111)
Creatinine, Ser: 0.98 mg/dL (ref 0.61–1.24)
GFR calc Af Amer: >60 mL/min (ref >60)
Glucose, Bld: 104 mg/dL — ABNORMAL HIGH (ref 70–99)
POTASSIUM: 3.5 mmol/L (ref 3.5–5.1)
Sodium: 137 mmol/L (ref 135–145)

## 2018-06-12 LAB — CBC
HEMATOCRIT: 46.5 % (ref 39.0–52.0)
HEMOGLOBIN: 15.2 g/dL (ref 13.0–17.0)
MCH: 30.6 pg (ref 26.0–34.0)
MCHC: 32.7 g/dL (ref 30.0–36.0)
MCV: 93.8 fL (ref 78.0–100.0)
Platelets: 194 10*3/uL (ref 150–400)
RBC: 4.96 MIL/uL (ref 4.22–5.81)
RDW: 14.2 % (ref 11.5–15.5)
WBC: 5.6 10*3/uL (ref 4.0–10.5)

## 2018-06-12 LAB — I-STAT TROPONIN, ED: Troponin i, poc: 0.02 ng/mL (ref 0.00–0.08)

## 2018-06-12 MED ORDER — LORAZEPAM 2 MG/ML IJ SOLN
1.0000 mg | Freq: Once | INTRAMUSCULAR | Status: AC
  Administered 2018-06-12: 1 mg via INTRAVENOUS
  Filled 2018-06-12: qty 1

## 2018-06-12 MED ORDER — SODIUM CHLORIDE 0.9 % IV SOLN
INTRAVENOUS | Status: DC
  Administered 2018-06-12: 20:00:00 via INTRAVENOUS

## 2018-06-12 MED ORDER — IBUPROFEN 600 MG PO TABS: 600.0000 mg | ORAL_TABLET | Freq: Four times a day (QID) | ORAL | 0 refills | Status: DC | PRN

## 2018-06-12 MED ORDER — IOPAMIDOL (ISOVUE-370) INJECTION 76%
100.0000 mL | Freq: Once | INTRAVENOUS | Status: AC | PRN
  Administered 2018-06-12: 100 mL via INTRAVENOUS

## 2018-06-12 MED ORDER — IOPAMIDOL (ISOVUE-370) INJECTION 76%
INTRAVENOUS | Status: AC
  Filled 2018-06-12: qty 100

## 2018-06-12 MED ORDER — METHOCARBAMOL 750 MG PO TABS: 750.0000 mg | ORAL_TABLET | Freq: Four times a day (QID) | ORAL | 0 refills | Status: DC

## 2018-06-12 MED ORDER — KETOROLAC TROMETHAMINE 30 MG/ML IJ SOLN
30.0000 mg | Freq: Once | INTRAMUSCULAR | Status: AC
  Administered 2018-06-12: 30 mg via INTRAVENOUS
  Filled 2018-06-12: qty 1

## 2018-06-12 NOTE — ED Notes (Addendum)
Patient refused to put on gown. States "I would rather not. You can hook up the wires around my shirt. The doctor has already been in here and looked at me".

## 2018-06-12 NOTE — ED Notes (Signed)
Patient called out and requests results of CT. States "I'm ready to go home. The results should be back by now." This RN explained delay to patient.

## 2018-06-12 NOTE — ED Notes (Signed)
MD at bedside. 

## 2018-06-12 NOTE — ED Triage Notes (Addendum)
C/O constant right lower chest pain since yesterday morning with worsening over past few hours.  C/O painful movements, SOB. C/O "getting winded" when walking. Denies any injury.

## 2018-06-12 NOTE — ED Notes (Signed)
EKG shown to Marilynn RailN. Burky, NP with hx review.  Recommend pt go to ED with spouse at this time.

## 2018-06-12 NOTE — ED Notes (Addendum)
Patient's BP 171/103; changed to a larger cuff and asked him to hold his arm still while it's going off. Patient states "well it hurts so I'm going to move my arm when it goes off. I have HTN."

## 2018-06-12 NOTE — ED Triage Notes (Addendum)
Pt sent to ED from Urgent Care with c/o right lower chest pain, onset yesterday with shortness of breath  Pt st's pain increases with walking or moving

## 2018-06-12 NOTE — ED Notes (Signed)
Pt transported to xray 

## 2018-06-12 NOTE — ED Provider Notes (Signed)
[facility] EMERGENCY DEPARTMENT Provider Note   CSN: [id number] Arrival date & time: 06/12/18  1827     History   Chief Complaint Chief Complaint  Patient presents with  . Chest Pain    HPI Jim Green is a 57 y.o. male.  57 year old male presents with right-sided chest pain x1 day.  Pain is located in his right lower costal margin and is worse with certain movements.  He also notes increased dyspnea on exertion.  Is unsure if his pain is pleuritic.  Denies any anginal quality to his current symptoms.  No hemoptysis.  No fever or cough.  Pain better with remaining still and no treatment used prior to arrival     Past Medical History:  Diagnosis Date  . Allergy   . Diabetes mellitus without complication (HCC)   . Eczema   . ED (erectile dysfunction)   . Hyperlipidemia   . Hypertension   . Obesity   . Sleep apnea     Patient Active Problem List   Diagnosis Date Noted  . Abdominal pain 09/29/2016  . Osteoarthritis of both knees 03/30/2016  . Benign paroxysmal positional vertigo 07/02/2015  . Diabetes mellitus type II, uncontrolled (HCC) 01/31/2014  . Back pain, chronic 08/29/2012  . Atherosclerosis of aorta (HCC) 08/29/2012  . DYSHIDROSIS 12/17/2009  . HYPERLIPIDEMIA 10/09/2009  . ERECTILE DYSFUNCTION 06/26/2008  . UMBILICAL HERNIA 06/26/2008  . Essential hypertension 06/24/2007    Past Surgical History:  Procedure Laterality Date  . KNEE ARTHROSCOPY     right knee  . NASAL SINUS SURGERY          Home Medications    Prior to Admission medications   Medication Sig Start Date End Date Taking? Authorizing Provider  cyclobenzaprine (FLEXERIL) 10 MG tablet Take 1 tablet (10 mg total) by mouth 3 (three) times daily as needed for muscle spasms. 05/12/17   Nafziger, Kandee Keen, NP  lisinopril-hydrochlorothiazide (PRINZIDE,ZESTORETIC) 20-25 MG tablet TAKE 1 TABLET BY MOUTH ONCE DAILY 06/03/18   Roderick Pee, MD  metFORMIN (GLUCOPHAGE) 500 MG  tablet TAKE ONE TABLET BY MOUTH ONCE DAILY WITH  BREAKFAST 10/07/17   Roderick Pee, MD  simvastatin (ZOCOR) 20 MG tablet TAKE ONE TABLET BY MOUTH ONCE DAILY AT BEDTIME 09/15/16   Roderick Pee, MD    Family History Family History  Problem Relation Age of Onset  . Heart disease Unknown   . Hypertension Unknown   . Obesity Unknown     Social History Social History   Tobacco Use  . Smoking status: Never Smoker  . Smokeless tobacco: Never Used  Substance Use Topics  . Alcohol use: Yes    Alcohol/week: 12.0 standard drinks    Types: 12 Standard drinks or equivalent per week  . Drug use: No     Allergies   Patient has no known allergies.   Review of Systems Review of Systems  All other systems reviewed and are negative.    Physical Exam Updated Vital Signs Ht 1.727 m (5\' 8" )   Wt 127 kg   BMI 42.57 kg/m   Physical Exam  Constitutional: He is oriented to person, place, and time. He appears well-developed and well-nourished.  Non-toxic appearance. No distress.  HENT:  Head: Normocephalic and atraumatic.  Eyes: Pupils are equal, round, and reactive to light. Conjunctivae, EOM and lids are normal.  Neck: Normal range of motion. Neck supple. No tracheal deviation present. No thyroid mass present.  Cardiovascular: Normal rate, regular rhythm and  normal heart sounds. Exam reveals no gallop.  No murmur heard. Pulmonary/Chest: Effort normal and breath sounds normal. No stridor. No respiratory distress. He has no decreased breath sounds. He has no wheezes. He has no rhonchi. He has no rales. He exhibits no tenderness and no crepitus.    Abdominal: Soft. Normal appearance and bowel sounds are normal. He exhibits no distension. There is no tenderness. There is no rebound and no CVA tenderness.  Musculoskeletal: Normal range of motion. He exhibits no edema or tenderness.  Neurological: He is alert and oriented to person, place, and time. He has normal strength. No cranial  nerve deficit or sensory deficit. GCS eye subscore is 4. GCS verbal subscore is 5. GCS motor subscore is 6.  Skin: Skin is warm and dry. No abrasion and no rash noted.  Psychiatric: He has a normal mood and affect. His speech is normal and behavior is normal.  Nursing note and vitals reviewed.    ED Treatments / Results  Labs (all labs ordered are listed, but only abnormal results are displayed) Labs Reviewed  BASIC METABOLIC PANEL - Abnormal; Notable for the following components:      Result Value   Glucose, Bld 104 (*)    All other components within normal limits  CBC  I-STAT TROPONIN, ED    EKG EKG Interpretation  Date/Time:  Sunday June 12 2018 18:37:40 EDT Ventricular Rate:  78 PR Interval:  150 QRS Duration: 88 QT Interval:  396 QTC Calculation: 451 R Axis:   80 Text Interpretation:  Normal sinus rhythm Normal ECG No significant change since last tracing Confirmed by Lorre NickAllen, Eulah Walkup ([id number]) on 06/12/2018 7:08:54 PM   Radiology Dg Chest 2 View  Result Date: 06/12/2018 CLINICAL DATA:  Right side chest pain, shortness of breath EXAM: CHEST - 2 VIEW COMPARISON:  12/27/2009 FINDINGS: Mild cardiomegaly. No confluent airspace opacities or effusions. No acute bony abnormality. IMPRESSION: Mild cardiomegaly.  No active disease. Electronically Signed   By: Charlett NoseKevin  Dover M.D.   On: 06/12/2018 19:08    Procedures Procedures (including critical care time)  Medications Ordered in ED Medications - No data to display   Initial Impression / Assessment and Plan / ED Course  I have reviewed the triage vital signs and the nursing notes.  Pertinent labs & imaging results that were available during my care of the patient were reviewed by me and considered in my medical decision making (see chart for details).   Given Ativan and feels better.  Patient had a chest CT that was negative for PE.  Suspect the patient has some component of pleurisy versus musculoskeletal etiology.  We will  also give dose of Toradol here and discharged home   Final Clinical Impressions(s) / ED Diagnoses   Final diagnoses:  None    ED Discharge Orders    None       Lorre NickAllen, Shanette Tamargo, MD 06/12/18 2212

## 2018-06-12 NOTE — ED Notes (Signed)
Patient verbalizes understanding of medications and discharge instructions. No further questions at this time. VSS and patient ambulatory at discharge.   

## 2018-06-27 ENCOUNTER — Ambulatory Visit: Payer: Commercial Managed Care - PPO | Admitting: Family Medicine

## 2018-06-27 ENCOUNTER — Encounter: Payer: Self-pay | Admitting: Family Medicine

## 2018-06-27 VITALS — BP 140/88 | HR 80 | Temp 98.5°F | Wt 282.1 lb

## 2018-06-27 DIAGNOSIS — B9789 Other viral agents as the cause of diseases classified elsewhere: Secondary | ICD-10-CM | POA: Diagnosis not present

## 2018-06-27 DIAGNOSIS — J069 Acute upper respiratory infection, unspecified: Secondary | ICD-10-CM | POA: Diagnosis not present

## 2018-06-27 DIAGNOSIS — J4520 Mild intermittent asthma, uncomplicated: Secondary | ICD-10-CM | POA: Insufficient documentation

## 2018-06-27 MED ORDER — SIMVASTATIN 20 MG PO TABS: 20.0000 mg | ORAL_TABLET | Freq: Every day | ORAL | 1 refills | Status: DC

## 2018-06-27 MED ORDER — PREDNISONE 20 MG PO TABS: ORAL_TABLET | ORAL | 1 refills | Status: DC

## 2018-06-27 MED ORDER — METFORMIN HCL 500 MG PO TABS: ORAL_TABLET | ORAL | 1 refills | Status: DC

## 2018-06-27 MED ORDER — HYDROCODONE-HOMATROPINE 5-1.5 MG/5ML PO SYRP: ORAL_SOLUTION | ORAL | 0 refills | Status: DC

## 2018-06-27 NOTE — Patient Instructions (Signed)
Drink lots of water  Hydromet.......Marland Kitchen 1/2-1 teaspoon at bedtime when necessary for cough  Prednisone 20 mg......... 2 tabs 3 days taper as outlined  Really feel your other medications  Make an appointment for CPX w Angela Adam n October or November

## 2018-06-27 NOTE — Progress Notes (Signed)
Jim Green is a 57 year old married male nonsmoker who works at the [employer] complex who comes in today with a four-day history of head congestion sore throat and cough  No fever chills etc.  Review of systems negative she's not been around sick people that he is aware of. No foreign travel. He was in the emergency room a week ago because of chest pain. Attorney to be chest wall pain. Cardiopulmonary evaluations were negative  BP 140/88 (BP Location: Left Arm, Patient Position: Sitting, Cuff Size: Large)   Pulse 80   Temp 98.5 F (36.9 C) (Oral)   Wt 282 lb 2 oz (128 kg)   SpO2 97%   BMI 42.90 kg/m  Well-developed well-nourished male no acute distress vital signs stable he is afebrile HEENT were negative neck was supple thyroid not enlarged no carotid bruits. Pulmonary exam showed symmetrical breath sounds mild wheezing symmetrically on forced expiration no crackles  #1 viral syndrome with secondary asthma........... Treat symptomatically with prednisone and cough syrup  #2 history of hypertension.......Marland Kitchen Refills meds return CPX  #3 diabetes.......Marland Kitchen Refilled meds return CPX  #4, hyperlipidemia,,,,,,, refilled meds return for CPX

## 2018-06-29 ENCOUNTER — Telehealth: Payer: Self-pay | Admitting: Family Medicine

## 2018-06-29 NOTE — Telephone Encounter (Unsigned)
Copied from CRM [id number]. Topic: Quick Communication - Rx Refill/Question >> Jun 29, 2018  1:01 PM Gaynelle Adu wrote: Medication: HYDROcodone-homatropine (HYCODAN) 5-1.5 MG/5ML syrup [[id number]]   Has the patient contacted their pharmacy? No   Preferred Pharmacy (with phone number or street name):[id number] - [place], [place] - [address] AT [facility] OF [address] & [address] [phone] (Phone) [phone] (Fax)  Patient is calling to state the medication  is no longer available at [facility], he was advise to have a new RX sent to [facility]. Patient stated he has been coughing a lot and really needs this medication. Please advise

## 2018-06-30 NOTE — Telephone Encounter (Signed)
Dr. Tawanna Coolerodd, please advise on the refill at the new pharmacy for the hycodan.  Thanks

## 2018-07-04 ENCOUNTER — Other Ambulatory Visit: Payer: Self-pay | Admitting: Family Medicine

## 2018-07-04 MED ORDER — HYDROCODONE-HOMATROPINE 5-1.5 MG/5ML PO SYRP: ORAL_SOLUTION | ORAL | 0 refills | Status: DC

## 2018-07-21 ENCOUNTER — Telehealth: Payer: Self-pay | Admitting: Family Medicine

## 2018-07-21 NOTE — Telephone Encounter (Signed)
Copied from CRM [id number]. Topic: Quick Communication - See Telephone Encounter >> Jul 21, 2018 12:55 PM Lorrine Kin, NT wrote: CRM for notification. See Telephone encounter for: 07/21/18. Patient calling and states that the pharmacy can only fill part of the HYDROcodone-homatropine (HYCODAN) 5-1.5 MG/5ML syrup and he would lose the other half. Patient would like to know if the prescription could be sent to [id number] - [place], [place] - [address] AT [facility] OF [address]#: [phone]

## 2018-07-22 NOTE — Telephone Encounter (Signed)
Copied from CRM [id number]. Topic: General - Other >> Jul 21, 2018 12:59 PM Debroah Loop wrote: Reason for CRM: Patient needs to know who Dr. Tawanna Cooler was referring him to for his feet concerns?

## 2018-07-28 NOTE — Telephone Encounter (Signed)
Left detailed message informing Pt of update.

## 2018-12-07 ENCOUNTER — Ambulatory Visit (HOSPITAL_COMMUNITY)
Admission: EM | Admit: 2018-12-07 | Discharge: 2018-12-07 | Disposition: A | Discharge status: Home / Self Care | Payer: Commercial Managed Care - PPO | Attending: Physician Assistant | Admitting: Physician Assistant

## 2018-12-07 ENCOUNTER — Encounter (HOSPITAL_COMMUNITY): Payer: Self-pay | Admitting: Emergency Medicine

## 2018-12-07 DIAGNOSIS — M25561 Pain in right knee: Secondary | ICD-10-CM

## 2018-12-07 DIAGNOSIS — E119 Type 2 diabetes mellitus without complications: Secondary | ICD-10-CM | POA: Diagnosis not present

## 2018-12-07 DIAGNOSIS — I10 Essential (primary) hypertension: Secondary | ICD-10-CM | POA: Diagnosis not present

## 2018-12-07 DIAGNOSIS — G8929 Other chronic pain: Secondary | ICD-10-CM

## 2018-12-07 MED ORDER — KETOROLAC TROMETHAMINE 30 MG/ML IJ SOLN
INTRAMUSCULAR | Status: AC
  Filled 2018-12-07: qty 1

## 2018-12-07 MED ORDER — MELOXICAM 7.5 MG PO TABS: 7.5000 mg | ORAL_TABLET | Freq: Every day | ORAL | 0 refills | Status: DC

## 2018-12-07 MED ORDER — KETOROLAC TROMETHAMINE 30 MG/ML IJ SOLN
30.0000 mg | Freq: Once | INTRAMUSCULAR | Status: AC
  Administered 2018-12-07: 30 mg via INTRAMUSCULAR

## 2018-12-07 NOTE — Discharge Instructions (Signed)
Start Mobic. Do not take ibuprofen (motrin/advil)/ naproxen (aleve) while on mobic. Ice compress, elevation, knee sleeve during activity. Follow up with PCP/orthopedics for further evaluation if symptoms till not improving.

## 2018-12-07 NOTE — ED Notes (Signed)
Patient able to ambulate independently with limp 

## 2018-12-07 NOTE — ED Provider Notes (Signed)
[facility]    CSN: [id number] Arrival date & time: 12/07/18  [id number]     History   Chief Complaint Chief Complaint  Patient presents with  . Knee Pain    HPI Jim Green is a 58 y.o. male.   58 year old male comes in for 1 to 2128-month history of right knee pain.  States was climbing stairs when he had heard a pop in the knee prior to current symptom onset.  Otherwise no obvious injury/trauma.  Pain is intermittent, worse with going downstairs.  Mild intermittent swelling.  Denies erythema, warmth.  Denies fever, chills, night sweats.  States it may be arthritis, but came in for evaluation as right knee pain has been worsening.  Denies numbness, tingling to the legs.  Denies ankle swelling.  Has not taken anything for the symptoms.     Past Medical History:  Diagnosis Date  . Allergy   . Diabetes mellitus without complication (HCC)   . Eczema   . ED (erectile dysfunction)   . Hyperlipidemia   . Hypertension   . Obesity   . Sleep apnea     Patient Active Problem List   Diagnosis Date Noted  . Viral URI with cough 06/27/2018  . Mild intermittent asthma without complication 06/27/2018  . Abdominal pain 09/29/2016  . Osteoarthritis of both knees 03/30/2016  . Benign paroxysmal positional vertigo 07/02/2015  . Diabetes mellitus type II, uncontrolled (HCC) 01/31/2014  . Back pain, chronic 08/29/2012  . Atherosclerosis of aorta (HCC) 08/29/2012  . DYSHIDROSIS 12/17/2009  . HYPERLIPIDEMIA 10/09/2009  . ERECTILE DYSFUNCTION 06/26/2008  . UMBILICAL HERNIA 06/26/2008  . Essential hypertension 06/24/2007    Past Surgical History:  Procedure Laterality Date  . KNEE ARTHROSCOPY     right knee  . NASAL SINUS SURGERY         Home Medications    Prior to Admission medications   Medication Sig Start Date End Date Taking? Authorizing Provider  cyclobenzaprine (FLEXERIL) 10 MG tablet Take 1 tablet (10 mg total) by mouth 3 (three) times daily as needed for  muscle spasms. 05/12/17   Nafziger, Kandee Keenory, NP  HYDROcodone-homatropine [facility](HYCODAN) 5-1.5 MG/5ML syrup 1/2-1 teaspoon at bedtime when necessary for cough Patient not taking: Reported on 12/07/2018 07/04/18   Roderick Peeodd, Jeffrey A, MD  ibuprofen (ADVIL,MOTRIN) 600 MG tablet Take 1 tablet (600 mg total) by mouth every 6 (six) hours as needed. 06/12/18   Lorre NickAllen, Anthony, MD  lisinopril-hydrochlorothiazide (PRINZIDE,ZESTORETIC) 20-25 MG tablet TAKE 1 TABLET BY MOUTH ONCE DAILY Patient taking differently: Take 1 tablet by mouth daily.  06/03/18   Roderick Peeodd, Jeffrey A, MD  meloxicam (MOBIC) 7.5 MG tablet Take 1 tablet (7.5 mg total) by mouth daily. 12/07/18   Cathie HoopsYu,  V, PA-C  metFORMIN (GLUCOPHAGE) 500 MG tablet TAKE ONE TABLET BY MOUTH ONCE DAILY WITH  BREAKFAST 06/27/18   Roderick Peeodd, Jeffrey A, MD  methocarbamol (ROBAXIN-750) 750 MG tablet Take 1 tablet (750 mg total) by mouth 4 (four) times daily. Patient not taking: Reported on 12/07/2018 06/12/18   Lorre NickAllen, Anthony, MD  predniSONE (DELTASONE) 20 MG tablet 2 tabs x 3 days, 1 tab x 3 days, 1/2 tab x 3 days, 1/2 tab M,W,F x 2 weeks Patient not taking: Reported on 12/07/2018 06/27/18   Roderick Peeodd, Jeffrey A, MD  simvastatin (ZOCOR) 20 MG tablet Take 1 tablet (20 mg total) by mouth daily with breakfast. 06/27/18   Roderick Peeodd, Jeffrey A, MD    Family History Family History  Problem Relation Age of  Onset  . Heart disease Other   . Hypertension Other   . Obesity Other     Social History Social History   Tobacco Use  . Smoking status: Never Smoker  . Smokeless tobacco: Never Used  Substance Use Topics  . Alcohol use: Yes    Alcohol/week: 12.0 standard drinks    Types: 12 Standard drinks or equivalent per week  . Drug use: No     Allergies   Patient has no known allergies.   Review of Systems Review of Systems  Reason unable to perform ROS: See HPI as above.     Physical Exam Triage Vital Signs ED Triage Vitals  Enc Vitals Group     BP 12/07/18 1031 (!) 156/81     Pulse Rate  12/07/18 1030 83     Resp 12/07/18 1030 18     Temp 12/07/18 1030 97.7 F (36.5 C)     Temp src --      SpO2 12/07/18 1030 100 %     Weight --      Height --      Head Circumference --      Peak Flow --      Pain Score 12/07/18 1031 9     Pain Loc --      Pain Edu? --      Excl. in GC? --    No data found.  Updated Vital Signs BP (!) 156/81   Pulse 83   Temp 97.7 F (36.5 C)   Resp 18   SpO2 100%   Physical Exam Constitutional:      General: He is not in acute distress.    Appearance: He is well-developed. He is not diaphoretic.  HENT:     Head: Normocephalic and atraumatic.  Eyes:     Conjunctiva/sclera: Conjunctivae normal.     Pupils: Pupils are equal, round, and reactive to light.  Musculoskeletal:     Comments: Mild generalized swelling to the right knee.  No erythema, warmth.  Tenderness to palpation of inferior patella.  Generalized tenderness to bilateral joint line.  Full range of motion of knee, though with increased pain during extension.  Strength normal and equal bilaterally.  Sensation intact and equal bilaterally.  Skin:    General: Skin is warm and dry.  Neurological:     Mental Status: He is alert and oriented to person, place, and time.      UC Treatments / Results  Labs (all labs ordered are listed, but only abnormal results are displayed) Labs Reviewed - No data to display  EKG None  Radiology No results found.  Procedures Procedures (including critical care time)  Medications Ordered in UC Medications  ketorolac (TORADOL) 30 MG/ML injection 30 mg (has no administration in time range)    Initial Impression / Assessment and Plan / UC Course  I have reviewed the triage vital signs and the nursing notes.  Pertinent labs & imaging results that were available during my care of the patient were reviewed by me and considered in my medical decision making (see chart for details).    Discussed possible bursitis, arthritis, inflammation  causing symptoms.  Toradol injection in office today.  Patient with history of diabetes, unsure last A1c, will avoid prednisone at this time.  Start Mobic as directed.  Ice compress, elevation, knee sleeve during activity.  Will have patient follow-up with PCP/orthopedic for further evaluation if symptoms not improving.  Patient expresses understanding and agrees to plan.  Final Clinical Impressions(s) / UC Diagnoses   Final diagnoses:  Chronic pain of right knee    ED Prescriptions    Medication Sig Dispense Auth. Provider   meloxicam (MOBIC) 7.5 MG tablet Take 1 tablet (7.5 mg total) by mouth daily. 15 tablet Threasa Alpha, [place] 12/07/18 1137

## 2018-12-07 NOTE — ED Triage Notes (Signed)
Pt states hes had R knee pain x1 month, states he thinks it might be arthritis but yesterday the pain was really bad.

## 2018-12-09 DIAGNOSIS — M25561 Pain in right knee: Secondary | ICD-10-CM | POA: Diagnosis not present

## 2019-01-06 ENCOUNTER — Other Ambulatory Visit: Payer: Self-pay | Admitting: *Deleted

## 2019-01-06 MED ORDER — LISINOPRIL-HYDROCHLOROTHIAZIDE 20-25 MG PO TABS: ORAL_TABLET | ORAL | 0 refills | Status: DC

## 2019-01-10 ENCOUNTER — Ambulatory Visit (INDEPENDENT_AMBULATORY_CARE_PROVIDER_SITE_OTHER): Payer: Commercial Managed Care - PPO | Admitting: Internal Medicine

## 2019-01-10 ENCOUNTER — Other Ambulatory Visit: Payer: Self-pay

## 2019-01-10 DIAGNOSIS — E785 Hyperlipidemia, unspecified: Secondary | ICD-10-CM | POA: Diagnosis not present

## 2019-01-10 DIAGNOSIS — I10 Essential (primary) hypertension: Secondary | ICD-10-CM | POA: Diagnosis not present

## 2019-01-10 DIAGNOSIS — E118 Type 2 diabetes mellitus with unspecified complications: Secondary | ICD-10-CM

## 2019-01-10 DIAGNOSIS — E1169 Type 2 diabetes mellitus with other specified complication: Secondary | ICD-10-CM | POA: Diagnosis not present

## 2019-01-10 MED ORDER — METFORMIN HCL 500 MG PO TABS: ORAL_TABLET | ORAL | 0 refills | Status: DC

## 2019-01-10 MED ORDER — LISINOPRIL-HYDROCHLOROTHIAZIDE 20-25 MG PO TABS: ORAL_TABLET | ORAL | 0 refills | Status: DC

## 2019-01-10 MED ORDER — SIMVASTATIN 20 MG PO TABS: 20.0000 mg | ORAL_TABLET | Freq: Every day | ORAL | 0 refills | Status: DC

## 2019-01-10 NOTE — Progress Notes (Signed)
Virtual Visit via Telephone Note  I connected with Jim Green on 01/10/19 at 10:00 AM EDT by telephone and verified that I am speaking with the correct person using two identifiers.   I discussed the limitations, risks, security and privacy concerns of performing an evaluation and management service by telephone and the availability of in person appointments. I also discussed with the patient that there may be a patient responsible charge related to this service. The patient expressed understanding and agreed to proceed.  Location patient: home Location provider: work office Participants present for the call: patient, provider Patient did not have a visit in the prior 7 days to address this/these issue(s).   History of Present Illness:  He is transferring care to me.  Previously seen by Dr. Tawanna Cooler in this office.  He has a history of hypertension and he takes lisinopril/hydrochlorothiazide, he states he has been compliant with his medications, he does not check his blood pressure routinely although he does own a blood pressure cuff.. He also has a history of type 2 diabetes and is on metformin, his most recent A1c was 6.6 but this was in 2017, he also has a history of hyperlipidemia, last lipid panel was in 2015 at which point his LDL was 134, he has been on simvastatin.  He has not had routine medical follow-up.  He admits to losing track of his appointments but has set a goal this year to be more healthy and on top of things.    Last month he had to go to urgent care because "his right knee was inflamed".  He was given a shot of Toradol and discharged on meloxicam, it does not appear that x-rays were performed.  He states that he is "100% better".   Observations/Objective: Patient sounds cheerful and well on the phone. I do not appreciate any increased work of breathing. Speech and thought processing are grossly intact. Patient reported vitals: None reported.   Current  Outpatient Medications:  .  cyclobenzaprine (FLEXERIL) 10 MG tablet, Take 1 tablet (10 mg total) by mouth 3 (three) times daily as needed for muscle spasms., Disp: 30 tablet, Rfl: 0 .  ibuprofen (ADVIL,MOTRIN) 600 MG tablet, Take 1 tablet (600 mg total) by mouth every 6 (six) hours as needed., Disp: 30 tablet, Rfl: 0 .  lisinopril-hydrochlorothiazide (PRINZIDE,ZESTORETIC) 20-25 MG tablet, TAKE 1 TABLET BY MOUTH ONCE DAILY, Disp: 90 tablet, Rfl: 0 .  meloxicam (MOBIC) 7.5 MG tablet, Take 1 tablet (7.5 mg total) by mouth daily., Disp: 15 tablet, Rfl: 0 .  metFORMIN (GLUCOPHAGE) 500 MG tablet, TAKE ONE TABLET BY MOUTH ONCE DAILY WITH  BREAKFAST, Disp: 90 tablet, Rfl: 0 .  simvastatin (ZOCOR) 20 MG tablet, Take 1 tablet (20 mg total) by mouth daily with breakfast., Disp: 90 tablet, Rfl: 0  ROS:  Constitutional: Denies fever, chills, diaphoresis, appetite change and fatigue.  HEENT: Denies photophobia, eye pain, redness, hearing loss, ear pain, congestion, sore throat, rhinorrhea, sneezing, mouth sores, trouble swallowing, neck pain, neck stiffness and tinnitus.   Respiratory: Denies SOB, DOE, cough, chest tightness,  and wheezing.   Cardiovascular: Denies chest pain, palpitations and leg swelling.  Gastrointestinal: Denies nausea, vomiting, abdominal pain, diarrhea, constipation, blood in stool and abdominal distention.  Genitourinary: Denies dysuria, urgency, frequency, hematuria, flank pain and difficulty urinating.  Endocrine: Denies: hot or cold intolerance, sweats, changes in hair or nails, polyuria, polydipsia. Musculoskeletal: Denies myalgias, back pain, joint swelling, arthralgias and gait problem.  Skin: Denies pallor, rash  and wound.  Neurological: Denies dizziness, seizures, syncope, weakness, light-headedness, numbness and headaches.  Hematological: Denies adenopathy. Easy bruising, personal or family bleeding history  Psychiatric/Behavioral: Denies suicidal ideation, mood changes,  confusion, nervousness, sleep disturbance and agitation   Assessment and Plan:  Hyperlipidemia associated with type 2 diabetes mellitus (HCC) -He knows that he is overdue for lipid panel, will try to arrange at time of next visit in 3 months. -For now he is to continue simvastatin at current dose.  Essential hypertension -Continue to take lisinopril/hydrochlorothiazide as prescribed. -Since he already has a blood pressure cuff at home, have asked that he check his blood pressure 2-3 times a week and notify us if they are elevated.  DM type 2, controlled, with complication (HCC) -He is most definitely due for an A1c, was controlled per A1c 2 years ago. -For now advised to continue current dose of metformin, when he returns in 3 months for recheck A1c. -Have asked him to check fasting blood sugars at least 2-3 times a week since he already has a glucose monitor.    I discussed the assessment and treatment plan with the patient. The patient was provided an opportunity to ask questions and all were answered. The patient agreed with the plan and demonstrated an understanding of the instructions.   The patient was advised to call back or seek an in-person evaluation if the symptoms worsen or if the condition fails to improve as anticipated.  I provided 17 minutes of non-face-to-face time during this encounter.   Chaya Jan, MD  Primary Care at [facility]

## 2019-04-18 ENCOUNTER — Ambulatory Visit: Payer: Self-pay | Admitting: Internal Medicine

## 2019-07-12 ENCOUNTER — Encounter (HOSPITAL_COMMUNITY): Payer: Self-pay

## 2019-07-12 ENCOUNTER — Ambulatory Visit (HOSPITAL_COMMUNITY)
Admission: EM | Admit: 2019-07-12 | Discharge: 2019-07-12 | Disposition: A | Discharge status: Home / Self Care | Payer: Commercial Managed Care - PPO | Attending: Family Medicine | Admitting: Family Medicine

## 2019-07-12 ENCOUNTER — Other Ambulatory Visit: Payer: Self-pay

## 2019-07-12 DIAGNOSIS — N3001 Acute cystitis with hematuria: Secondary | ICD-10-CM | POA: Diagnosis present

## 2019-07-12 DIAGNOSIS — I10 Essential (primary) hypertension: Secondary | ICD-10-CM

## 2019-07-12 DIAGNOSIS — R51 Headache: Secondary | ICD-10-CM | POA: Diagnosis not present

## 2019-07-12 DIAGNOSIS — R509 Fever, unspecified: Secondary | ICD-10-CM | POA: Diagnosis not present

## 2019-07-12 LAB — POCT URINALYSIS DIP (DEVICE)
Glucose, UA: NEGATIVE mg/dL
Ketones, ur: NEGATIVE mg/dL
Nitrite: NEGATIVE
Protein, ur: 100 mg/dL — AB
Specific Gravity, Urine: 1.025 (ref 1.005–1.030)
Urobilinogen, UA: 0.2 mg/dL (ref 0.0–1.0)
pH: 5.5 (ref 5.0–8.0)

## 2019-07-12 MED ORDER — CEFTRIAXONE SODIUM 1 G IJ SOLR
1.0000 g | Freq: Once | INTRAMUSCULAR | Status: AC
  Administered 2019-07-12: 1 g via INTRAMUSCULAR

## 2019-07-12 MED ORDER — LIDOCAINE HCL (PF) 1 % IJ SOLN
INTRAMUSCULAR | Status: AC
  Filled 2019-07-12: qty 2

## 2019-07-12 MED ORDER — CEFTRIAXONE SODIUM 1 G IJ SOLR
INTRAMUSCULAR | Status: AC
  Filled 2019-07-12: qty 10

## 2019-07-12 MED ORDER — SULFAMETHOXAZOLE-TRIMETHOPRIM 800-160 MG PO TABS: 1.0000 | ORAL_TABLET | Freq: Two times a day (BID) | ORAL | 0 refills | Status: AC

## 2019-07-12 MED ORDER — ACETAMINOPHEN 325 MG PO TABS
ORAL_TABLET | ORAL | Status: AC
  Filled 2019-07-12: qty 2

## 2019-07-12 MED ORDER — ACETAMINOPHEN 325 MG PO TABS
975.0000 mg | ORAL_TABLET | Freq: Once | ORAL | Status: AC
  Administered 2019-07-12: 975 mg via ORAL

## 2019-07-12 NOTE — Discharge Instructions (Addendum)
Treating you for a urinary tract infection.  Antibiotic injection given here in clinic and I am sending some more antibiotics to the pharmacy for you to start taking today for the next 7 days. Patient you are drinking plenty of fluids.  Tylenol or ibuprofen for fever and pain If your symptoms do not improve or worsen over the next couple days you need to go to the ER.

## 2019-07-12 NOTE — ED Triage Notes (Signed)
Pt states he has a headache. X 2 days  Pt states he has been voiding very small amount frequently. This started yesterday.

## 2019-07-12 NOTE — ED Notes (Signed)
Patient able to ambulate independently  

## 2019-07-13 ENCOUNTER — Other Ambulatory Visit: Payer: Self-pay | Admitting: *Deleted

## 2019-07-13 DIAGNOSIS — E1169 Type 2 diabetes mellitus with other specified complication: Secondary | ICD-10-CM

## 2019-07-13 NOTE — ED Provider Notes (Signed)
[place]    CSN: [id number] Arrival date & time: 07/12/19  1456      History   Chief Complaint Chief Complaint  Patient presents with  . Headache    HPI Jim Green is a 58 y.o. male.   Patient is a 58 year old male with past medical history of allergy, diabetes, eczema, ED, hyperlipidemia, hypertension, obesity, sleep apnea.  He presents today with fever, headache, urinary retention, dysuria.  This has been present since yesterday.  Symptoms have worsened.  He has not been taking any medication for the fever or headache.  Denies any history of enlarged prostate or prostate cancer.  No abdominal pain.  He has had some mild lower back discomfort.  No respiratory symptoms.  Denies any concern for STDs.  No testicle pain or swelling.  No rashes  ROS per HPI    Headache   Past Medical History:  Diagnosis Date  . Allergy   . Diabetes mellitus without complication ([place])   . Eczema   . ED (erectile dysfunction)   . Hyperlipidemia   . Hypertension   . Obesity   . Sleep apnea     Patient Active Problem List   Diagnosis Date Noted  . Viral URI with cough 06/27/2018  . Mild intermittent asthma without complication 88/91/6945  . Abdominal pain 09/29/2016  . Osteoarthritis of both knees 03/30/2016  . Benign paroxysmal positional vertigo 07/02/2015  . Diabetes mellitus type II, uncontrolled ([place]) 01/31/2014  . Back pain, chronic 08/29/2012  . Atherosclerosis of aorta ([place]) 08/29/2012  . DYSHIDROSIS 12/17/2009  . Hyperlipidemia associated with type 2 diabetes mellitus ([place]) 10/09/2009  . ERECTILE DYSFUNCTION 06/26/2008  . UMBILICAL HERNIA 03/88/8280  . Essential hypertension 06/24/2007    Past Surgical History:  Procedure Laterality Date  . KNEE ARTHROSCOPY     right knee  . NASAL SINUS SURGERY         Home Medications    Prior to Admission medications   Medication Sig Start Date End Date Taking? Authorizing Provider  cyclobenzaprine  (FLEXERIL) 10 MG tablet Take 1 tablet (10 mg total) by mouth 3 (three) times daily as needed for muscle spasms. 05/12/17   Nafziger, Tommi Rumps, NP  ibuprofen (ADVIL,MOTRIN) 600 MG tablet Take 1 tablet (600 mg total) by mouth every 6 (six) hours as needed. 06/12/18   Lacretia Leigh, MD  lisinopril-hydrochlorothiazide (PRINZIDE,ZESTORETIC) 20-25 MG tablet TAKE 1 TABLET BY MOUTH ONCE DAILY 01/10/19   Isaac Bliss, Rayford Halsted, MD  meloxicam (MOBIC) 7.5 MG tablet Take 1 tablet (7.5 mg total) by mouth daily. 12/07/18   Tasia Catchings, Amy V, PA-C  metFORMIN (GLUCOPHAGE) 500 MG tablet TAKE ONE TABLET BY MOUTH ONCE DAILY WITH  BREAKFAST 01/10/19   Isaac Bliss, Rayford Halsted, MD  simvastatin (ZOCOR) 20 MG tablet Take 1 tablet (20 mg total) by mouth daily with breakfast. 01/10/19   Isaac Bliss, Rayford Halsted, MD  sulfamethoxazole-trimethoprim (BACTRIM DS) 800-160 MG tablet Take 1 tablet by mouth 2 (two) times daily for 7 days. 07/12/19 07/19/19  Orvan July, NP    Family History Family History  Problem Relation Age of Onset  . Heart disease Other   . Hypertension Other   . Obesity Other     Social History Social History   Tobacco Use  . Smoking status: Never Smoker  . Smokeless tobacco: Never Used  Substance Use Topics  . Alcohol use: Yes    Alcohol/week: 12.0 standard drinks    Types: 12 Standard drinks or equivalent per  week  . Drug use: No     Allergies   Patient has no known allergies.   Review of Systems Review of Systems  Neurological: Positive for headaches.     Physical Exam Triage Vital Signs ED Triage Vitals  Enc Vitals Group     BP 07/12/19 1524 (!) 161/81     Pulse Rate 07/12/19 1524 (!) 114     Resp 07/12/19 1524 18     Temp 07/12/19 1524 (!) 100.5 F (38.1 C)     Temp Source 07/12/19 1524 Oral     SpO2 07/12/19 1524 97 %     Weight 07/12/19 1522 280 lb (127 kg)     Height --      Head Circumference --      Peak Flow --      Pain Score 07/12/19 1522 2     Pain Loc --      Pain  Edu? --      Excl. in GC? --    No data found.  Updated Vital Signs BP (!) 161/81 (BP Location: Right Arm)   Pulse (!) 114   Temp (!) 100.5 F (38.1 C) (Oral)   Resp 18   Wt 280 lb (127 kg)   SpO2 97%   BMI 42.57 kg/m   Visual Acuity Right Eye Distance:   Left Eye Distance:   Bilateral Distance:    Right Eye Near:   Left Eye Near:    Bilateral Near:     Physical Exam Constitutional:      General: He is not in acute distress.    Appearance: He is ill-appearing. He is not toxic-appearing or diaphoretic.  HENT:     Head: Normocephalic and atraumatic.  Neck:     Musculoskeletal: Normal range of motion.  Cardiovascular:     Rate and Rhythm: Tachycardia present.  Pulmonary:     Effort: Pulmonary effort is normal.     Breath sounds: Normal breath sounds.  Abdominal:     Palpations: Abdomen is soft.     Tenderness: There is no abdominal tenderness.  Musculoskeletal: Normal range of motion.  Skin:    General: Skin is warm and dry.  Neurological:     Mental Status: He is alert.  Psychiatric:        Mood and Affect: Mood normal.      UC Treatments / Results  Labs (all labs ordered are listed, but only abnormal results are displayed) Labs Reviewed  POCT URINALYSIS DIP (DEVICE) - Abnormal; Notable for the following components:      Result Value   Bilirubin Urine SMALL (*)    Hgb urine dipstick MODERATE (*)    Protein, ur 100 (*)    Leukocytes,Ua SMALL (*)    All other components within normal limits  URINE CULTURE    EKG   Radiology No results found.  Procedures Procedures (including critical care time)  Medications Ordered in UC Medications  cefTRIAXone (ROCEPHIN) injection 1 g (1 g Intramuscular Given 07/12/19 1623)  acetaminophen (TYLENOL) tablet 975 mg (975 mg Oral Given 07/12/19 1623)  acetaminophen (TYLENOL) 325 MG tablet (has no administration in time range)  cefTRIAXone (ROCEPHIN) 1 g injection (has no administration in time range)  lidocaine  (PF) (XYLOCAINE) 1 % injection (has no administration in time range)    Initial Impression / Assessment and Plan / UC Course  I have reviewed the triage vital signs and the nursing notes.  Pertinent labs & imaging results that were  available during my care of the patient were reviewed by me and considered in my medical decision making (see chart for details).     Symptoms and urinalysis consistent with UTI.  Will treat for this with Rocephin injection here in clinic and sent home with Bactrim. Tylenol recommended for fever and headache. Tylenol given here for fever and headache Recommended pushing fluids Strict return precautions that if symptoms do not improve or worsen he will need to follow-up here or go to the ER. Patient understanding and agree. Final Clinical Impressions(s) / UC Diagnoses   Final diagnoses:  Acute cystitis with hematuria     Discharge Instructions     Treating you for a urinary tract infection.  Antibiotic injection given here in clinic and I am sending some more antibiotics to the pharmacy for you to start taking today for the next 7 days. Patient you are drinking plenty of fluids.  Tylenol or ibuprofen for fever and pain If your symptoms do not improve or worsen over the next couple days you need to go to the ER.    ED Prescriptions    Medication Sig Dispense Auth. Provider   sulfamethoxazole-trimethoprim (BACTRIM DS) 800-160 MG tablet Take 1 tablet by mouth 2 (two) times daily for 7 days. 14 tablet Jasiel Belisle A, NP     PDMP not reviewed this encounter.   Dahlia Byes A, NP 07/13/19 0830

## 2019-07-14 ENCOUNTER — Telehealth (HOSPITAL_COMMUNITY): Payer: Self-pay | Admitting: Emergency Medicine

## 2019-07-14 ENCOUNTER — Telehealth: Payer: Self-pay | Admitting: Internal Medicine

## 2019-07-14 DIAGNOSIS — E1169 Type 2 diabetes mellitus with other specified complication: Secondary | ICD-10-CM

## 2019-07-14 LAB — URINE CULTURE: Culture: 50000 — AB

## 2019-07-14 NOTE — Telephone Encounter (Signed)
Requested medication (s) are due for refill today: yes  Requested medication (s) are on the active medication list: yes  Last refill:  12/21/2018  Future visit scheduled: yes  Notes to clinic:  Review for refill   Requested Prescriptions  Pending Prescriptions Disp Refills   simvastatin (ZOCOR) 20 MG tablet 90 tablet 0    Sig: Take 1 tablet (20 mg total) by mouth daily with breakfast.     Cardiovascular:  Antilipid - Statins Failed - 07/14/2019 10:03 AM      Failed - Total Cholesterol in normal range and within 360 days    Cholesterol  Date Value Ref Range Status  01/01/2014 194 0 - 200 mg/dL Final    Comment:    ATP III Classification       Desirable:  < 200 mg/dL               Borderline High:  200 - 239 mg/dL          High:  > = 240 mg/dL         Failed - LDL in normal range and within 360 days    LDL Cholesterol  Date Value Ref Range Status  01/01/2014 134 (H) 0 - 99 mg/dL Final         Failed - HDL in normal range and within 360 days    HDL  Date Value Ref Range Status  01/01/2014 39.70 >39.00 mg/dL Final         Failed - Triglycerides in normal range and within 360 days    Triglycerides  Date Value Ref Range Status  01/01/2014 100.0 0.0 - 149.0 mg/dL Final    Comment:    Normal:  <150 mg/dLBorderline High:  150 - 199 mg/dL         Passed - Patient is not pregnant      Passed - Valid encounter within last 12 months    Recent Outpatient Visits          6 months ago Hyperlipidemia associated with type 2 diabetes mellitus ([place])   [place] [facility] [place], MD   1 year ago Viral URI with cough   [place] at [facility], Jory Ee, MD   2 years ago Muscle strain   [facility], [place], NP   2 years ago Lower abdominal pain   [facility], Jory Ee, MD   3 years ago Other secondary osteoarthritis of both [place] at [facility], Malka So,  MD      Future Appointments            In 1 week Isaac Bliss, Rayford Halsted, MD [employer] [facility] [place], [facility]

## 2019-07-14 NOTE — Telephone Encounter (Signed)
Please advise 

## 2019-07-14 NOTE — Telephone Encounter (Signed)
Pt request refill  simvastatin (ZOCOR) 20 MG tablet  Advised pt he needs an appt. Pt has scheduled appt for 10/02. However, pt states he has been out of medication for a week, and requesting refill today.  Can you send enough to get him through to his appt?   [place], [place] - [place] [phone] (Phone) [phone] (Fax)

## 2019-07-14 NOTE — Telephone Encounter (Signed)
Urine culture was positive for e coli and was given bactrim  at urgent care visit.. Attempted to reach patient. No answer at this time.   

## 2019-07-21 ENCOUNTER — Other Ambulatory Visit: Payer: Self-pay

## 2019-07-21 ENCOUNTER — Telehealth (INDEPENDENT_AMBULATORY_CARE_PROVIDER_SITE_OTHER): Payer: Commercial Managed Care - PPO | Admitting: Internal Medicine

## 2019-07-21 DIAGNOSIS — E1165 Type 2 diabetes mellitus with hyperglycemia: Secondary | ICD-10-CM | POA: Diagnosis not present

## 2019-07-21 DIAGNOSIS — E118 Type 2 diabetes mellitus with unspecified complications: Secondary | ICD-10-CM | POA: Diagnosis not present

## 2019-07-21 DIAGNOSIS — I10 Essential (primary) hypertension: Secondary | ICD-10-CM

## 2019-07-21 DIAGNOSIS — E1169 Type 2 diabetes mellitus with other specified complication: Secondary | ICD-10-CM

## 2019-07-21 DIAGNOSIS — E785 Hyperlipidemia, unspecified: Secondary | ICD-10-CM

## 2019-07-21 MED ORDER — LISINOPRIL-HYDROCHLOROTHIAZIDE 20-25 MG PO TABS: ORAL_TABLET | ORAL | 0 refills | Status: DC

## 2019-07-21 MED ORDER — SIMVASTATIN 20 MG PO TABS: 20.0000 mg | ORAL_TABLET | Freq: Every day | ORAL | 0 refills | Status: DC

## 2019-07-21 MED ORDER — METFORMIN HCL 500 MG PO TABS: ORAL_TABLET | ORAL | 0 refills | Status: DC

## 2019-07-21 NOTE — Progress Notes (Signed)
Virtual Visit via Telephone Note  I connected with Jim Green on 07/21/19 at  1:30 PM EDT by telephone and verified that I am speaking with the correct person using two identifiers.   I discussed the limitations, risks, security and privacy concerns of performing an evaluation and management service by telephone and the availability of in person appointments. I also discussed with the patient that there may be a patient responsible charge related to this service. The patient expressed understanding and agreed to proceed.  Location patient: home Location provider: work office Participants present for the call: patient, provider Patient did not have a visit in the prior 7 days to address this/these issue(s).   History of Present Illness:  He has made this appointment as his medication refills were denied. He is on lipitor for HLD, metformin for DM and lisinopril/HCTZ for HTN. He has not been seen in person or had labs in >2 years. He has no acute complaints. Has no concerns today. He is willing to schedule follow up appointment for CPE. Her has no BPs or CBGs to share with me today.   Observations/Objective: Patient sounds cheerful and well on the phone. I do not appreciate any increased work of breathing. Speech and thought processing are grossly intact. Patient reported vitals: none reported   Current Outpatient Medications:  .  cyclobenzaprine (FLEXERIL) 10 MG tablet, Take 1 tablet (10 mg total) by mouth 3 (three) times daily as needed for muscle spasms., Disp: 30 tablet, Rfl: 0 .  ibuprofen (ADVIL,MOTRIN) 600 MG tablet, Take 1 tablet (600 mg total) by mouth every 6 (six) hours as needed., Disp: 30 tablet, Rfl: 0 .  lisinopril-hydrochlorothiazide (ZESTORETIC) 20-25 MG tablet, TAKE 1 TABLET BY MOUTH ONCE DAILY, Disp: 30 tablet, Rfl: 0 .  meloxicam (MOBIC) 7.5 MG tablet, Take 1 tablet (7.5 mg total) by mouth daily., Disp: 15 tablet, Rfl: 0 .  metFORMIN (GLUCOPHAGE) 500 MG  tablet, TAKE ONE TABLET BY MOUTH ONCE DAILY WITH&amp;nbsp;&amp;nbsp;BREAKFAST, Disp: 30 tablet, Rfl: 0 .  simvastatin (ZOCOR) 20 MG tablet, Take 1 tablet (20 mg total) by mouth daily with breakfast., Disp: 30 tablet, Rfl: 0  Review of Systems:  Constitutional: Denies fever, chills, diaphoresis, appetite change and fatigue.  HEENT: Denies photophobia, eye pain, redness, hearing loss, ear pain, congestion, sore throat, rhinorrhea, sneezing, mouth sores, trouble swallowing, neck pain, neck stiffness and tinnitus.   Respiratory: Denies SOB, DOE, cough, chest tightness,  and wheezing.   Cardiovascular: Denies chest pain, palpitations and leg swelling.  Gastrointestinal: Denies nausea, vomiting, abdominal pain, diarrhea, constipation, blood in stool and abdominal distention.  Genitourinary: Denies dysuria, urgency, frequency, hematuria, flank pain and difficulty urinating.  Endocrine: Denies: hot or cold intolerance, sweats, changes in hair or nails, polyuria, polydipsia. Musculoskeletal: Denies myalgias, back pain, joint swelling, arthralgias and gait problem.  Skin: Denies pallor, rash and wound.  Neurological: Denies dizziness, seizures, syncope, weakness, light-headedness, numbness and headaches.  Hematological: Denies adenopathy. Easy bruising, personal or family bleeding history  Psychiatric/Behavioral: Denies suicidal ideation, mood changes, confusion, nervousness, sleep disturbance and agitation   Assessment and Plan:  Uncontrolled type 2 diabetes mellitus with hyperglycemia (HCC) Hyperlipidemia associated with type 2 diabetes mellitus (HCC) Essential hypertension  -Have advised that I will refill meds for 30 days, but no more will be authorized until he schedules his CPE. -He understands and is willing to schedule.     I discussed the assessment and treatment plan with the patient. The patient was provided an opportunity to  ask questions and all were answered. The patient agreed  with the plan and demonstrated an understanding of the instructions.   The patient was advised to call back or seek an in-person evaluation if the symptoms worsen or if the condition fails to improve as anticipated.  I provided 11 minutes of non-face-to-face time during this encounter.   Lelon Frohlich, MD  Primary Care at [facility]

## 2019-07-21 NOTE — Telephone Encounter (Addendum)
Patient checking on the status of simvastatin (ZOCOR) 20 MG tablet , stating its been 2 weeks since his request and would like a follow up call. Patient aware of virtual visit appointment today    [facility] [place], [place]

## 2019-07-21 NOTE — Telephone Encounter (Signed)
Refill sent 07/21/2019. Left message on machine for patient to schedule a physical for more refills.

## 2019-08-15 ENCOUNTER — Encounter: Payer: Self-pay | Admitting: Internal Medicine

## 2019-08-15 ENCOUNTER — Other Ambulatory Visit: Payer: Self-pay

## 2019-08-15 ENCOUNTER — Ambulatory Visit (INDEPENDENT_AMBULATORY_CARE_PROVIDER_SITE_OTHER): Payer: Commercial Managed Care - PPO | Admitting: Internal Medicine

## 2019-08-15 VITALS — BP 140/90 | HR 67 | Temp 98.0°F | Ht 68.0 in | Wt 261.8 lb

## 2019-08-15 DIAGNOSIS — Z125 Encounter for screening for malignant neoplasm of prostate: Secondary | ICD-10-CM

## 2019-08-15 DIAGNOSIS — E785 Hyperlipidemia, unspecified: Secondary | ICD-10-CM | POA: Diagnosis not present

## 2019-08-15 DIAGNOSIS — Z23 Encounter for immunization: Secondary | ICD-10-CM

## 2019-08-15 DIAGNOSIS — I10 Essential (primary) hypertension: Secondary | ICD-10-CM

## 2019-08-15 DIAGNOSIS — E1165 Type 2 diabetes mellitus with hyperglycemia: Secondary | ICD-10-CM

## 2019-08-15 DIAGNOSIS — E1169 Type 2 diabetes mellitus with other specified complication: Secondary | ICD-10-CM | POA: Diagnosis not present

## 2019-08-15 DIAGNOSIS — Z0000 Encounter for general adult medical examination without abnormal findings: Secondary | ICD-10-CM | POA: Diagnosis not present

## 2019-08-15 LAB — CBC WITH DIFFERENTIAL/PLATELET
Basophils Absolute: 0 10*3/uL (ref 0.0–0.1)
Basophils Relative: 0.5 % (ref 0.0–3.0)
Eosinophils Absolute: 0 10*3/uL (ref 0.0–0.7)
Eosinophils Relative: 0.7 % (ref 0.0–5.0)
HCT: 41.2 % (ref 39.0–52.0)
Hemoglobin: 13.7 g/dL (ref 13.0–17.0)
Lymphocytes Relative: 49.3 % — ABNORMAL HIGH (ref 12.0–46.0)
Lymphs Abs: 2 10*3/uL (ref 0.7–4.0)
MCHC: 33.3 g/dL (ref 30.0–36.0)
MCV: 92 fl (ref 78.0–100.0)
Monocytes Absolute: 0.5 10*3/uL (ref 0.1–1.0)
Monocytes Relative: 13.2 % — ABNORMAL HIGH (ref 3.0–12.0)
Neutro Abs: 1.5 10*3/uL (ref 1.4–7.7)
Neutrophils Relative %: 36.3 % — ABNORMAL LOW (ref 43.0–77.0)
Platelets: 161 10*3/uL (ref 150.0–400.0)
RBC: 4.48 Mil/uL (ref 4.22–5.81)
RDW: 15.3 % (ref 11.5–15.5)
WBC: 4.1 10*3/uL (ref 4.0–10.5)

## 2019-08-15 LAB — COMPREHENSIVE METABOLIC PANEL
ALT: 13 U/L (ref 0–53)
AST: 16 U/L (ref 0–37)
Albumin: 3.8 g/dL (ref 3.5–5.2)
Alkaline Phosphatase: 76 U/L (ref 39–117)
BUN: 12 mg/dL (ref 6–23)
CO2: 34 mEq/L — ABNORMAL HIGH (ref 19–32)
Calcium: 8.8 mg/dL (ref 8.4–10.5)
Chloride: 99 mEq/L (ref 96–112)
Creatinine, Ser: 0.73 mg/dL (ref 0.40–1.50)
GFR: 132.49 mL/min (ref >60.00)
Glucose, Bld: 108 mg/dL — ABNORMAL HIGH (ref 70–99)
Potassium: 3.1 mEq/L — ABNORMAL LOW (ref 3.5–5.1)
Sodium: 139 mEq/L (ref 135–145)
Total Bilirubin: 0.6 mg/dL (ref 0.2–1.2)
Total Protein: 6.8 g/dL (ref 6.0–8.3)

## 2019-08-15 LAB — LIPID PANEL
Cholesterol: 155 mg/dL (ref 0–200)
HDL: 55.3 mg/dL (ref >39.00)
LDL Cholesterol: 90 mg/dL (ref 0–99)
NonHDL: 99.4
Total CHOL/HDL Ratio: 3
Triglycerides: 48 mg/dL (ref 0.0–149.0)
VLDL: 9.6 mg/dL (ref 0.0–40.0)

## 2019-08-15 LAB — HEMOGLOBIN A1C: Hgb A1c MFr Bld: 6.1 % (ref 4.6–6.5)

## 2019-08-15 NOTE — Patient Instructions (Addendum)
-Nice seeing you today!!  -Lab work today; will notify you once results are available.  -Pneumonia and first shingles vaccine today.  -We have sent out a referral for an eye doctor.  -Remember to check your blood pressure at home 2-3 times a week and bring those numbers in to your next visit.  -Schedule follow up in 3-4 months.   Preventive Care 1-60 Years Old, Male Preventive care refers to lifestyle choices and visits with your health care provider that can promote health and wellness. This includes:  A yearly physical exam. This is also called an annual well check.  Regular dental and eye exams.  Immunizations.  Screening for certain conditions.  Healthy lifestyle choices, such as eating a healthy diet, getting regular exercise, not using drugs or products that contain nicotine and tobacco, and limiting alcohol use. What can I expect for my preventive care visit? Physical exam Your health care provider will check:  Height and weight. These may be used to calculate body mass index (BMI), which is a measurement that tells if you are at a healthy weight.  Heart rate and blood pressure.  Your skin for abnormal spots. Counseling Your health care provider may ask you questions about:  Alcohol, tobacco, and drug use.  Emotional well-being.  Home and relationship well-being.  Sexual activity.  Eating habits.  Work and work Statistician. What immunizations do I need?  Influenza (flu) vaccine  This is recommended every year. Tetanus, diphtheria, and pertussis (Tdap) vaccine  You may need a Td booster every 10 years. Varicella (chickenpox) vaccine  You may need this vaccine if you have not already been vaccinated. Zoster (shingles) vaccine  You may need this after age 31. Measles, mumps, and rubella (MMR) vaccine  You may need at least one dose of MMR if you were born in 1957 or later. You may also need a second dose. Pneumococcal conjugate (PCV13) vaccine   You may need this if you have certain conditions and were not previously vaccinated. Pneumococcal polysaccharide (PPSV23) vaccine  You may need one or two doses if you smoke cigarettes or if you have certain conditions. Meningococcal conjugate (MenACWY) vaccine  You may need this if you have certain conditions. Hepatitis A vaccine  You may need this if you have certain conditions or if you travel or work in places where you may be exposed to hepatitis A. Hepatitis B vaccine  You may need this if you have certain conditions or if you travel or work in places where you may be exposed to hepatitis B. Haemophilus influenzae type b (Hib) vaccine  You may need this if you have certain risk factors. Human papillomavirus (HPV) vaccine  If recommended by your health care provider, you may need three doses over 6 months. You may receive vaccines as individual doses or as more than one vaccine together in one shot (combination vaccines). Talk with your health care provider about the risks and benefits of combination vaccines. What tests do I need? Blood tests  Lipid and cholesterol levels. These may be checked every 5 years, or more frequently if you are over 14 years old.  Hepatitis C test.  Hepatitis B test. Screening  Lung cancer screening. You may have this screening every year starting at age 86 if you have a 30-pack-year history of smoking and currently smoke or have quit within the past 15 years.  Prostate cancer screening. Recommendations will vary depending on your family history and other risks.  Colorectal cancer screening. All  adults should have this screening starting at age 64 and continuing until age 43. Your health care provider may recommend screening at age 44 if you are at increased risk. You will have tests every 1-10 years, depending on your results and the type of screening test.  Diabetes screening. This is done by checking your blood sugar (glucose) after you have not  eaten for a while (fasting). You may have this done every 1-3 years.  Sexually transmitted disease (STD) testing. Follow these instructions at home: Eating and drinking  Eat a diet that includes fresh fruits and vegetables, whole grains, lean protein, and low-fat dairy products.  Take vitamin and mineral supplements as recommended by your health care provider.  Do not drink alcohol if your health care provider tells you not to drink.  If you drink alcohol: ? Limit how much you have to 0-2 drinks a day. ? Be aware of how much alcohol is in your drink. In the [place] one drink equals one 12 oz bottle of beer (355 mL), one 5 oz glass of wine (148 mL), or one 1 oz glass of hard liquor (44 mL). Lifestyle  Take daily care of your teeth and gums.  Stay active. Exercise for at least 30 minutes on 5 or more days each week.  Do not use any products that contain nicotine or tobacco, such as cigarettes, e-cigarettes, and chewing tobacco. If you need help quitting, ask your health care provider.  If you are sexually active, practice safe sex. Use a condom or other form of protection to prevent STIs (sexually transmitted infections).  Talk with your health care provider about taking a low-dose aspirin every day starting at age 15. What's next?  Go to your health care provider once a year for a well check visit.  Ask your health care provider how often you should have your eyes and teeth checked.  Stay up to date on all vaccines. This information is not intended to replace advice given to you by your health care provider. Make sure you discuss any questions you have with your health care provider. Document Released: 11/01/2015 Document Revised: 09/29/2018 Document Reviewed: 09/29/2018 Elsevier Patient Education  2020 [employer].

## 2019-08-15 NOTE — Addendum Note (Signed)
Addended by: Suzette Battiest on: 08/15/2019 10:17 AM   Modules accepted: Orders

## 2019-08-15 NOTE — Addendum Note (Signed)
Addended by: Westley Hummer B on: 08/15/2019 03:45 PM   Modules accepted: Orders

## 2019-08-15 NOTE — Progress Notes (Signed)
Established Patient Office Visit     CC/Reason for Visit: Annual preventive exam  HPI: Jim Green is a 58 y.o. male who is coming in today for the above mentioned reasons. Past Medical History is significant for: Hyperlipidemia, type 2 diabetes on metformin and hypertension.  He had not been seen by had labs in over 2 years.  He has no acute complaints today.  He has lost 20 pounds since his last visit in 2019.  He does not have routine eye or dental care.  He agrees to receive Pneumovax and for shingles vaccination today.  Needs referral to ophthalmology for eye exam.  Colonoscopy in 2011 and he was a 5-year callback due to polyps, will refer back to GI.   Past Medical/Surgical History: Past Medical History:  Diagnosis Date  . Allergy   . Diabetes mellitus without complication ([place])   . Eczema   . ED (erectile dysfunction)   . Hyperlipidemia   . Hypertension   . Obesity   . Sleep apnea     Past Surgical History:  Procedure Laterality Date  . KNEE ARTHROSCOPY     right knee  . NASAL SINUS SURGERY      Social History:  reports that he has never smoked. He has never used smokeless tobacco. He reports current alcohol use of about 12.0 standard drinks of alcohol per week. He reports that he does not use drugs.  Allergies: No Known Allergies  Family History:  Family History  Problem Relation Age of Onset  . Heart disease Other   . Hypertension Other   . Obesity Other      Current Outpatient Medications:  .  ibuprofen (ADVIL,MOTRIN) 600 MG tablet, Take 1 tablet (600 mg total) by mouth every 6 (six) hours as needed., Disp: 30 tablet, Rfl: 0 .  lisinopril-hydrochlorothiazide (ZESTORETIC) 20-25 MG tablet, TAKE 1 TABLET BY MOUTH ONCE DAILY, Disp: 30 tablet, Rfl: 0 .  metFORMIN (GLUCOPHAGE) 500 MG tablet, TAKE ONE TABLET BY MOUTH ONCE DAILY WITH&amp;nbsp;&amp;nbsp;BREAKFAST, Disp: 30 tablet, Rfl: 0 .  simvastatin (ZOCOR) 20 MG tablet, Take 1 tablet (20 mg total) by  mouth daily with breakfast., Disp: 30 tablet, Rfl: 0  Review of Systems:  Constitutional: Denies fever, chills, diaphoresis, appetite change and fatigue.  HEENT: Denies photophobia, eye pain, redness, hearing loss, ear pain, congestion, sore throat, rhinorrhea, sneezing, mouth sores, trouble swallowing, neck pain, neck stiffness and tinnitus.   Respiratory: Denies SOB, DOE, cough, chest tightness,  and wheezing.   Cardiovascular: Denies chest pain, palpitations and leg swelling.  Gastrointestinal: Denies nausea, vomiting, abdominal pain, diarrhea, constipation, blood in stool and abdominal distention.  Genitourinary: Denies dysuria, urgency, frequency, hematuria, flank pain and difficulty urinating.  Endocrine: Denies: hot or cold intolerance, sweats, changes in hair or nails, polyuria, polydipsia. Musculoskeletal: Denies myalgias, back pain, joint swelling, arthralgias and gait problem.  Skin: Denies pallor, rash and wound.  Neurological: Denies dizziness, seizures, syncope, weakness, light-headedness, numbness and headaches.  Hematological: Denies adenopathy. Easy bruising, personal or family bleeding history  Psychiatric/Behavioral: Denies suicidal ideation, mood changes, confusion, nervousness, sleep disturbance and agitation    Physical Exam: Vitals:   08/15/19 0941  BP: 140/90  Pulse: 67  Temp: 98 F (36.7 C)  TempSrc: Temporal  SpO2: 99%  Weight: 261 lb 12.8 oz (118.8 kg)  Height: 5' 8" (1.727 m)    Body mass index is 39.81 kg/m.   Constitutional: NAD, calm, comfortable Eyes: PERRL, lids and conjunctivae normal ENMT: Mucous membranes are  moist.Tympanic membrane is pearly white, no erythema or bulging. Neck: normal, supple, no masses, no thyromegaly Respiratory: clear to auscultation bilaterally, no wheezing, no crackles. Normal respiratory effort. No accessory muscle use.  Cardiovascular: Regular rate and rhythm, no murmurs / rubs / gallops. No extremity edema. 2+ pedal  pulses. No carotid bruits.  Abdomen: no tenderness, no masses palpated. No hepatosplenomegaly. Bowel sounds positive.  Musculoskeletal: no clubbing / cyanosis. No joint deformity upper and lower extremities. Good ROM, no contractures. Normal muscle tone.  Skin: no rashes, lesions, ulcers. No induration Neurologic: CN 2-12 grossly intact. Sensation intact, DTR normal. Strength 5/5 in all 4.  Psychiatric: Normal judgment and insight. Alert and oriented x 3. Normal mood.    Impression and Plan:  Encounter for preventive health examination -Needs routine eye and dental care. -Will receive Pneumovax and first shingles today, otherwise immunizations are up-to-date. -Screening labs today. -Healthy lifestyle has been discussed in detail today, including a low-sodium diet. -Had a colonoscopy in 2011 was supposed to be a 5-year callback, will refer back to GI. -Will refer to ophthalmology for diabetic eye exam. -Check PSA.  Uncontrolled type 2 diabetes mellitus with hyperglycemia ([place])  -On Metformin, check A1c. -Last A1c on file was 6.6 in 2017.  Hyperlipidemia associated with type 2 diabetes mellitus ([facility])  -No LDL on file but he is on Lipitor, check lipids today.  Essential hypertension  -Uncontrolled with a blood pressure 140/90 in office today. -He will do ambulatory blood pressure monitoring and return in 3 months for follow-up. -For now continue current dose of lisinopril/hydrochlorothiazide.   Patient Instructions  -Nice seeing you today!!  -Lab work today; will notify you once results are available.  -Pneumonia and first shingles vaccine today.  -We have sent out a referral for an eye doctor.  -Remember to check your blood pressure at home 2-3 times a week and bring those numbers in to your next visit.  -Schedule follow up in 3-4 months.   Preventive Care 67-77 Years Old, Male Preventive care refers to lifestyle choices and visits with your health care provider that can  promote health and wellness. This includes:  A yearly physical exam. This is also called an annual well check.  Regular dental and eye exams.  Immunizations.  Screening for certain conditions.  Healthy lifestyle choices, such as eating a healthy diet, getting regular exercise, not using drugs or products that contain nicotine and tobacco, and limiting alcohol use. What can I expect for my preventive care visit? Physical exam Your health care provider will check:  Height and weight. These may be used to calculate body mass index (BMI), which is a measurement that tells if you are at a healthy weight.  Heart rate and blood pressure.  Your skin for abnormal spots. Counseling Your health care provider may ask you questions about:  Alcohol, tobacco, and drug use.  Emotional well-being.  Home and relationship well-being.  Sexual activity.  Eating habits.  Work and work Statistician. What immunizations do I need?  Influenza (flu) vaccine  This is recommended every year. Tetanus, diphtheria, and pertussis (Tdap) vaccine  You may need a Td booster every 10 years. Varicella (chickenpox) vaccine  You may need this vaccine if you have not already been vaccinated. Zoster (shingles) vaccine  You may need this after age 79. Measles, mumps, and rubella (MMR) vaccine  You may need at least one dose of MMR if you were born in 1957 or later. You may also need a second dose.  Pneumococcal conjugate (PCV13) vaccine  You may need this if you have certain conditions and were not previously vaccinated. Pneumococcal polysaccharide (PPSV23) vaccine  You may need one or two doses if you smoke cigarettes or if you have certain conditions. Meningococcal conjugate (MenACWY) vaccine  You may need this if you have certain conditions. Hepatitis A vaccine  You may need this if you have certain conditions or if you travel or work in places where you may be exposed to hepatitis A. Hepatitis  B vaccine  You may need this if you have certain conditions or if you travel or work in places where you may be exposed to hepatitis B. Haemophilus influenzae type b (Hib) vaccine  You may need this if you have certain risk factors. Human papillomavirus (HPV) vaccine  If recommended by your health care provider, you may need three doses over 6 months. You may receive vaccines as individual doses or as more than one vaccine together in one shot (combination vaccines). Talk with your health care provider about the risks and benefits of combination vaccines. What tests do I need? Blood tests  Lipid and cholesterol levels. These may be checked every 5 years, or more frequently if you are over 50 years old.  Hepatitis C test.  Hepatitis B test. Screening  Lung cancer screening. You may have this screening every year starting at age 55 if you have a 30-pack-year history of smoking and currently smoke or have quit within the past 15 years.  Prostate cancer screening. Recommendations will vary depending on your family history and other risks.  Colorectal cancer screening. All adults should have this screening starting at age 50 and continuing until age 75. Your health care provider may recommend screening at age 45 if you are at increased risk. You will have tests every 1-10 years, depending on your results and the type of screening test.  Diabetes screening. This is done by checking your blood sugar (glucose) after you have not eaten for a while (fasting). You may have this done every 1-3 years.  Sexually transmitted disease (STD) testing. Follow these instructions at home: Eating and drinking  Eat a diet that includes fresh fruits and vegetables, whole grains, lean protein, and low-fat dairy products.  Take vitamin and mineral supplements as recommended by your health care provider.  Do not drink alcohol if your health care provider tells you not to drink.  If you drink alcohol: ?  Limit how much you have to 0-2 drinks a day. ? Be aware of how much alcohol is in your drink. In the [place] one drink equals one 12 oz bottle of beer (355 mL), one 5 oz glass of wine (148 mL), or one 1 oz glass of hard liquor (44 mL). Lifestyle  Take daily care of your teeth and gums.  Stay active. Exercise for at least 30 minutes on 5 or more days each week.  Do not use any products that contain nicotine or tobacco, such as cigarettes, e-cigarettes, and chewing tobacco. If you need help quitting, ask your health care provider.  If you are sexually active, practice safe sex. Use a condom or other form of protection to prevent STIs (sexually transmitted infections).  Talk with your health care provider about taking a low-dose aspirin every day starting at age 50. What's next?  Go to your health care provider once a year for a well check visit.  Ask your health care provider how often you should have your eyes and teeth checked.    Stay up to date on all vaccines. This information is not intended to replace advice given to you by your health care provider. Make sure you discuss any questions you have with your health care provider. Document Released: 11/01/2015 Document Revised: 09/29/2018 Document Reviewed: 09/29/2018 Elsevier Patient Education  2020 [place], MD [facility]

## 2019-08-17 ENCOUNTER — Encounter: Payer: Self-pay | Admitting: Internal Medicine

## 2019-08-17 ENCOUNTER — Other Ambulatory Visit: Payer: Self-pay | Admitting: Internal Medicine

## 2019-08-17 DIAGNOSIS — E785 Hyperlipidemia, unspecified: Secondary | ICD-10-CM

## 2019-08-17 DIAGNOSIS — E559 Vitamin D deficiency, unspecified: Secondary | ICD-10-CM

## 2019-08-17 DIAGNOSIS — E538 Deficiency of other specified B group vitamins: Secondary | ICD-10-CM | POA: Insufficient documentation

## 2019-08-17 DIAGNOSIS — E876 Hypokalemia: Secondary | ICD-10-CM

## 2019-08-17 DIAGNOSIS — E1169 Type 2 diabetes mellitus with other specified complication: Secondary | ICD-10-CM

## 2019-08-17 LAB — VITAMIN D 25 HYDROXY (VIT D DEFICIENCY, FRACTURES): VITD: 12.76 ng/mL — ABNORMAL LOW (ref 30.00–100.00)

## 2019-08-17 LAB — PSA: PSA: 2 ng/mL (ref 0.10–4.00)

## 2019-08-17 LAB — TSH: TSH: 1.63 u[IU]/mL (ref 0.35–4.50)

## 2019-08-17 LAB — VITAMIN B12: Vitamin B-12: 185 pg/mL — ABNORMAL LOW (ref 211–911)

## 2019-08-17 MED ORDER — VITAMIN D (ERGOCALCIFEROL) 1.25 MG (50000 UNIT) PO CAPS: 50000.0000 [IU] | ORAL_CAPSULE | ORAL | 0 refills | Status: AC

## 2019-08-17 MED ORDER — POTASSIUM CHLORIDE CRYS ER 20 MEQ PO TBCR: 40.0000 meq | EXTENDED_RELEASE_TABLET | Freq: Every day | ORAL | 0 refills | Status: DC

## 2019-08-17 MED ORDER — SIMVASTATIN 40 MG PO TABS: 40.0000 mg | ORAL_TABLET | Freq: Every day | ORAL | 1 refills | Status: DC

## 2019-08-18 ENCOUNTER — Other Ambulatory Visit: Payer: Self-pay | Admitting: Internal Medicine

## 2019-08-18 ENCOUNTER — Other Ambulatory Visit: Payer: Self-pay | Admitting: *Deleted

## 2019-08-18 DIAGNOSIS — E118 Type 2 diabetes mellitus with unspecified complications: Secondary | ICD-10-CM

## 2019-08-18 DIAGNOSIS — E785 Hyperlipidemia, unspecified: Secondary | ICD-10-CM

## 2019-08-18 DIAGNOSIS — E1169 Type 2 diabetes mellitus with other specified complication: Secondary | ICD-10-CM

## 2019-08-18 DIAGNOSIS — E559 Vitamin D deficiency, unspecified: Secondary | ICD-10-CM

## 2019-08-18 MED ORDER — SIMVASTATIN 40 MG PO TABS: 40.0000 mg | ORAL_TABLET | Freq: Every day | ORAL | 1 refills | Status: DC

## 2019-08-18 MED ORDER — METFORMIN HCL 500 MG PO TABS: ORAL_TABLET | ORAL | 1 refills | Status: DC

## 2019-08-22 ENCOUNTER — Telehealth: Payer: Self-pay | Admitting: *Deleted

## 2019-08-22 ENCOUNTER — Encounter: Payer: Self-pay | Admitting: *Deleted

## 2019-08-22 DIAGNOSIS — I10 Essential (primary) hypertension: Secondary | ICD-10-CM

## 2019-08-22 MED ORDER — LISINOPRIL-HYDROCHLOROTHIAZIDE 20-25 MG PO TABS: ORAL_TABLET | ORAL | 1 refills | Status: DC

## 2019-08-22 NOTE — Telephone Encounter (Signed)
Copied from [place] [id number]. Topic: General - Other >> Aug 22, 2019 10:53 AM Keene Breath wrote: Reason for CRM: Patient called to speak with the nurse regarding his physical.  He said he has some questions.  CB# [phone]

## 2019-08-22 NOTE — Patient Instructions (Signed)

## 2019-08-22 NOTE — Telephone Encounter (Signed)
Spoke with patient and explained B12 injections. Patient was confused with directions for his Vit D and Potassium.  Patient is going to call back with more information.  CRM

## 2019-08-22 NOTE — Telephone Encounter (Signed)
Spoke with patent and review medications and directions.

## 2019-08-28 ENCOUNTER — Other Ambulatory Visit: Payer: Self-pay

## 2019-08-28 ENCOUNTER — Ambulatory Visit (INDEPENDENT_AMBULATORY_CARE_PROVIDER_SITE_OTHER): Payer: Commercial Managed Care - PPO | Admitting: *Deleted

## 2019-08-28 DIAGNOSIS — E538 Deficiency of other specified B group vitamins: Secondary | ICD-10-CM | POA: Diagnosis not present

## 2019-08-28 MED ORDER — CYANOCOBALAMIN 1000 MCG/ML IJ SOLN
1000.0000 ug | Freq: Once | INTRAMUSCULAR | Status: AC
  Administered 2019-08-28: 1000 ug via INTRAMUSCULAR

## 2019-08-28 NOTE — Progress Notes (Signed)
Per orders of Dr. Ethlyn Gallery injection of Cyanocobalamin 1023mcg given by Agnes Lawrence. Patient tolerated injection well.

## 2019-09-04 ENCOUNTER — Ambulatory Visit (INDEPENDENT_AMBULATORY_CARE_PROVIDER_SITE_OTHER): Payer: Commercial Managed Care - PPO | Admitting: *Deleted

## 2019-09-04 ENCOUNTER — Other Ambulatory Visit: Payer: Self-pay

## 2019-09-04 DIAGNOSIS — E538 Deficiency of other specified B group vitamins: Secondary | ICD-10-CM

## 2019-09-04 MED ORDER — CYANOCOBALAMIN 1000 MCG/ML IJ SOLN
1000.0000 ug | Freq: Once | INTRAMUSCULAR | Status: AC
  Administered 2019-09-04: 1000 ug via INTRAMUSCULAR

## 2019-09-04 NOTE — Progress Notes (Signed)
Per orders of Dr. Elease Hashimoto, injection of Cyancobalamin 1012mcg given by Agnes Lawrence. Patient tolerated injection well.

## 2019-09-08 ENCOUNTER — Other Ambulatory Visit: Payer: Self-pay

## 2019-09-11 ENCOUNTER — Ambulatory Visit (INDEPENDENT_AMBULATORY_CARE_PROVIDER_SITE_OTHER): Payer: Commercial Managed Care - PPO | Admitting: Family Medicine

## 2019-09-11 ENCOUNTER — Other Ambulatory Visit: Payer: Self-pay

## 2019-09-11 DIAGNOSIS — E538 Deficiency of other specified B group vitamins: Secondary | ICD-10-CM | POA: Diagnosis not present

## 2019-09-11 MED ORDER — CYANOCOBALAMIN 1000 MCG/ML IJ SOLN
1000.0000 ug | Freq: Once | INTRAMUSCULAR | Status: AC
  Administered 2019-09-11: 1000 ug via INTRAMUSCULAR

## 2019-09-11 NOTE — Progress Notes (Signed)
Patient came in for Vitamin B12 injection. Zeeva Courser CMA gave in left deltoid at the request of the patient, "he only wanted in left arm". Patient tolerated well.  Agree with injection as given above.  Eulas Post MD [facility] at [facility]

## 2019-09-12 ENCOUNTER — Ambulatory Visit (INDEPENDENT_AMBULATORY_CARE_PROVIDER_SITE_OTHER): Payer: Commercial Managed Care - PPO | Admitting: Internal Medicine

## 2019-09-12 ENCOUNTER — Ambulatory Visit (INDEPENDENT_AMBULATORY_CARE_PROVIDER_SITE_OTHER): Payer: Commercial Managed Care - PPO

## 2019-09-12 ENCOUNTER — Encounter: Payer: Self-pay | Admitting: Internal Medicine

## 2019-09-12 ENCOUNTER — Telehealth: Payer: Self-pay | Admitting: *Deleted

## 2019-09-12 VITALS — BP 160/90 | HR 80 | Temp 98.4°F | Wt 260.0 lb

## 2019-09-12 DIAGNOSIS — G8929 Other chronic pain: Secondary | ICD-10-CM

## 2019-09-12 DIAGNOSIS — I10 Essential (primary) hypertension: Secondary | ICD-10-CM | POA: Diagnosis not present

## 2019-09-12 DIAGNOSIS — M25561 Pain in right knee: Secondary | ICD-10-CM

## 2019-09-12 NOTE — Telephone Encounter (Signed)
Spoke with patient.

## 2019-09-12 NOTE — Addendum Note (Signed)
Addended by: Suzette Battiest on: 09/12/2019 02:03 PM   Modules accepted: Orders

## 2019-09-12 NOTE — Telephone Encounter (Signed)
Copied from [place] [id number]. Topic: General - Other >> Sep 12, 2019 11:58 AM Leward Quan A wrote: Reason for CRM: Patient called said that he had a missed call no answer at the office. He can be reached at Ph#  [phone]

## 2019-09-12 NOTE — Patient Instructions (Signed)
-  Nice seeing you today!!  -Xrays today.  -Referral to sports medicine.  -Please check blood pressure at home. Will schedule an 8 week follow up to discuss.

## 2019-09-12 NOTE — Progress Notes (Signed)
Established Patient Office Visit     This visit occurred during the SARS-CoV-2 public health emergency.  Safety protocols were in place, including screening questions prior to the visit, additional usage of staff PPE, and extensive cleaning of exam room while observing appropriate contact time as indicated for disinfecting solutions.    CC/Reason for Visit: He is here to discuss right knee pain.   HPI: Jim Green is a 58 y.o. male who is coming in today for the above mentioned reasons.  He has had this knee pain since around April, however it has worsened in the past couple weeks.  He is now having difficulty weightbearing, going up and down steps and bending causes increased pain.  He states his knee feels "achy".  No injury that he can recall.  States he had some kind of arthroscopic surgery many years ago but cannot recall the exact details.  He has tried icing, bracing, ibuprofen without relief.   Past Medical/Surgical History: Past Medical History:  Diagnosis Date  . Allergy   . Diabetes mellitus without complication ([place])   . Eczema   . ED (erectile dysfunction)   . Hyperlipidemia   . Hypertension   . Obesity   . Sleep apnea     Past Surgical History:  Procedure Laterality Date  . KNEE ARTHROSCOPY     right knee  . NASAL SINUS SURGERY      Social History:  reports that he has never smoked. He has never used smokeless tobacco. He reports current alcohol use of about 12.0 standard drinks of alcohol per week. He reports that he does not use drugs.  Allergies: No Known Allergies  Family History:  Family History  Problem Relation Age of Onset  . Heart disease Other   . Hypertension Other   . Obesity Other      Current Outpatient Medications:  .  ibuprofen (ADVIL,MOTRIN) 600 MG tablet, Take 1 tablet (600 mg total) by mouth every 6 (six) hours as needed., Disp: 30 tablet, Rfl: 0 .  lisinopril-hydrochlorothiazide (ZESTORETIC) 20-25 MG tablet, TAKE 1 TABLET  BY MOUTH ONCE DAILY, Disp: 90 tablet, Rfl: 1 .  metFORMIN (GLUCOPHAGE) 500 MG tablet, TAKE ONE TABLET BY MOUTH ONCE DAILY WITH  BREAKFAST, Disp: 90 tablet, Rfl: 1 .  simvastatin (ZOCOR) 40 MG tablet, Take 1 tablet (40 mg total) by mouth daily with breakfast., Disp: 90 tablet, Rfl: 1 .  Vitamin D, Ergocalciferol, (DRISDOL) 1.25 MG (50000 UT) CAPS capsule, Take 1 capsule (50,000 Units total) by mouth every 7 (seven) days for 12 doses., Disp: 12 capsule, Rfl: 0 .  potassium chloride SA (KLOR-CON) 20 MEQ tablet, Take 2 tablets (40 mEq total) by mouth daily for 5 days., Disp: 10 tablet, Rfl: 0  Review of Systems:  Constitutional: Denies fever, chills, diaphoresis, appetite change and fatigue.  HEENT: Denies photophobia, eye pain, redness, hearing loss, ear pain, congestion, sore throat, rhinorrhea, sneezing, mouth sores, trouble swallowing, neck pain, neck stiffness and tinnitus.   Respiratory: Denies SOB, DOE, cough, chest tightness,  and wheezing.   Cardiovascular: Denies chest pain, palpitations and leg swelling.  Gastrointestinal: Denies nausea, vomiting, abdominal pain, diarrhea, constipation, blood in stool and abdominal distention.  Genitourinary: Denies dysuria, urgency, frequency, hematuria, flank pain and difficulty urinating.  Endocrine: Denies: hot or cold intolerance, sweats, changes in hair or nails, polyuria, polydipsia. Musculoskeletal: Denies myalgias, back pain. Skin: Denies pallor, rash and wound.  Neurological: Denies dizziness, seizures, syncope, weakness, light-headedness, numbness and headaches.  Hematological:  Denies adenopathy. Easy bruising, personal or family bleeding history  Psychiatric/Behavioral: Denies suicidal ideation, mood changes, confusion, nervousness, sleep disturbance and agitation    Physical Exam: Vitals:   09/12/19 1336  BP: (!) 160/90  Pulse: 80  Temp: 98.4 F (36.9 C)  TempSrc: Temporal  SpO2: 96%  Weight: 260 lb (117.9 kg)    Body mass index  is 39.53 kg/m.   Constitutional: NAD, calm, comfortable Eyes: PERRL, lids and conjunctivae normal ENMT: Mucous membranes are moist. Respiratory: clear to auscultation bilaterally, no wheezing, no crackles. Normal respiratory effort. No accessory muscle use.  Cardiovascular: Regular rate and rhythm, no murmurs / rubs / gallops. No extremity edema. 2+ pedal pulses.   Abdomen: no tenderness, no masses palpated. No hepatosplenomegaly. Bowel sounds positive.  Musculoskeletal: Difficulty extending the knee beyond 90 degrees, no visualized or palpable edema, no joint laxity. Skin: no rashes, lesions, ulcers. No induration Neurologic: CN 2-12 grossly intact. Sensation intact, DTR normal. Strength 5/5 in all 4.  Psychiatric: Normal judgment and insight. Alert and oriented x 3. Normal mood.    Impression and Plan:  Chronic pain of right knee  -Suspect due to osteoarthritis. -X-rays today. -Continue icing, bracing, exercises (provided), as needed NSAIDs, weight loss. -Refer to sports medicine, intra-articular steroids might be helpful.  Essential hypertension -Blood pressure is again elevated in office today, he does not want to discuss augmenting his antihypertensive therapy today. -Will arrange 6-week follow-up to further discuss.    Patient Instructions  -Nice seeing you today!!  -Xrays today.  -Referral to sports medicine.  -Please check blood pressure at home. Will schedule an 8 week follow up to discuss.     Chaya Jan, MD [facility] at [facility]

## 2019-09-18 ENCOUNTER — Other Ambulatory Visit: Payer: Self-pay

## 2019-09-18 ENCOUNTER — Telehealth: Payer: Self-pay | Admitting: Internal Medicine

## 2019-09-18 ENCOUNTER — Ambulatory Visit (INDEPENDENT_AMBULATORY_CARE_PROVIDER_SITE_OTHER): Payer: Commercial Managed Care - PPO | Admitting: Family Medicine

## 2019-09-18 DIAGNOSIS — E538 Deficiency of other specified B group vitamins: Secondary | ICD-10-CM | POA: Diagnosis not present

## 2019-09-18 DIAGNOSIS — Z20822 Contact with and (suspected) exposure to covid-19: Secondary | ICD-10-CM

## 2019-09-18 MED ORDER — CYANOCOBALAMIN 1000 MCG/ML IJ SOLN
1000.0000 ug | Freq: Once | INTRAMUSCULAR | Status: AC
  Administered 2019-09-18: 11:00:00 1000 ug via INTRAMUSCULAR

## 2019-09-18 NOTE — Telephone Encounter (Signed)
Pt came in and stated that he does not have the supplies to give himself the B-12 inj and would like to have it sent to his pharmacy.  [facility]: [facility] [address].

## 2019-09-18 NOTE — Progress Notes (Signed)
Patient was given a vitamin B12 injection in the right deltoid and tolerated well.   Agree with injection as given.  Eulas Post MD [place]

## 2019-09-19 LAB — NOVEL CORONAVIRUS, NAA: SARS-CoV-2, NAA: NOT DETECTED

## 2019-09-20 NOTE — Telephone Encounter (Signed)
Please advise. [place] for rx?

## 2019-09-20 NOTE — Telephone Encounter (Signed)
Yes, ok to send supplies for once monthly x 6

## 2019-09-22 MED ORDER — "BD DISP NEEDLE 25G X 1"" MISC": 5 refills | Status: DC

## 2019-09-22 MED ORDER — CYANOCOBALAMIN 1000 MCG/ML IJ SOLN: 1000.0000 ug | Freq: Once | INTRAMUSCULAR | 5 refills | Status: DC

## 2019-09-22 NOTE — Telephone Encounter (Signed)
Rx sent in. Patient is aware.  

## 2019-09-22 NOTE — Addendum Note (Signed)
Addended by: Rebecca Eaton on: 09/22/2019 11:46 AM   Modules accepted: Orders

## 2019-10-11 ENCOUNTER — Ambulatory Visit: Payer: Commercial Managed Care - PPO

## 2019-10-24 ENCOUNTER — Ambulatory Visit: Payer: Commercial Managed Care - PPO | Admitting: Internal Medicine

## 2019-11-08 ENCOUNTER — Ambulatory Visit: Payer: Commercial Managed Care - PPO | Attending: Internal Medicine

## 2019-11-08 DIAGNOSIS — Z20822 Contact with and (suspected) exposure to covid-19: Secondary | ICD-10-CM

## 2019-11-09 LAB — NOVEL CORONAVIRUS, NAA: SARS-CoV-2, NAA: DETECTED — AB

## 2019-11-09 NOTE — Progress Notes (Signed)
Your test for COVID-19 was positive ("detected"), meaning that you were infected with the novel coronavirus and could give the germ to others.    Please continue isolation at home, for at least 10 days since the start of your fever/cough/breathlessness and until you have had 24 hours without fever (without taking a fever reducer) and with any cough/breathlessness improving. Use over-the-counter medications for symptoms.  If you have had no symptoms, but were exposed to someone who was positive for COVID-19, you will need to quarantine and self-isolate for 14 days from the date of exposure.    Please continue good preventive care measures, including:  frequent hand-washing, avoid touching your face, cover coughs/sneezes, stay out of crowds and keep a 6 foot distance from others.  Clean hard surfaces touched frequently with disinfectant cleaning products.   Please check in with your primary care provider about your positive test result.  Go to the nearest urgent care or ED for assessment if you have severe breathlessness or severe weakness/fatigue (ex needing new help getting out of bed or to the bathroom).  Members of your household will also need to quarantine for 14 days from the date of your positive test.You may also be contacted by the health department for follow up. Please call [place] at [phone] if you have any questions or concerns.     

## 2019-11-10 ENCOUNTER — Telehealth (HOSPITAL_COMMUNITY): Payer: Self-pay | Admitting: Nurse Practitioner

## 2019-11-10 ENCOUNTER — Other Ambulatory Visit (HOSPITAL_COMMUNITY): Payer: Self-pay | Admitting: Nurse Practitioner

## 2019-11-10 DIAGNOSIS — U071 COVID-19: Secondary | ICD-10-CM

## 2019-11-10 NOTE — Telephone Encounter (Signed)
Spoke to patient regarding MAB therapy for covid infection. See orders note.

## 2019-11-10 NOTE — Progress Notes (Signed)
  I connected by phone with Jim Green on 11/10/2019 at 12:57 PM to discuss the potential use of an new treatment for mild to moderate COVID-19 viral infection in non-hospitalized patients.  This patient is a 59 y.o. male that meets the FDA criteria for Emergency Use Authorization of bamlanivimab or casirivimab\imdevimab.  Has a (+) direct SARS-CoV-2 viral test result  Has mild or moderate COVID-19   Is ? 61 years of age and weighs ? 40 kg  Is NOT hospitalized due to COVID-19  Is NOT requiring oxygen therapy or requiring an increase in baseline oxygen flow rate due to COVID-19  Is within 10 days of symptom onset  Has at least one of the high risk factor(s) for progression to severe COVID-19 and/or hospitalization as defined in EUA.  Specific high risk criteria : Diabetes, BMI, > 55 and hypertension  I have spoken and communicated the following to the patient or parent/caregiver:  1. FDA has authorized the emergency use of bamlanivimab and casirivimab\imdevimab for the treatment of mild to moderate COVID-19 in adults and pediatric patients with positive results of direct SARS-CoV-2 viral testing who are 13 years of age and older weighing at least 40 kg, and who are at high risk for progressing to severe COVID-19 and/or hospitalization.  2. The significant known and potential risks and benefits of bamlanivimab and casirivimab\imdevimab, and the extent to which such potential risks and benefits are unknown.  3. Information on available alternative treatments and the risks and benefits of those alternatives, including clinical trials.  4. Patients treated with bamlanivimab and casirivimab\imdevimab should continue to self-isolate and use infection control measures (e.g., wear mask, isolate, social distance, avoid sharing personal items, clean and disinfect "high touch" surfaces, and frequent handwashing) according to CDC guidelines.   5. The patient or parent/caregiver has the option to  accept or refuse bamlanivimab or casirivimab\imdevimab .  After reviewing this information with the patient, The patient agreed to proceed with receiving the bamlanimivab infusion and will be provided a copy of the Fact sheet prior to receiving the infusion.Alinda Dooms 11/10/2019 12:57 PM

## 2019-11-13 ENCOUNTER — Other Ambulatory Visit: Payer: Self-pay

## 2019-11-14 ENCOUNTER — Ambulatory Visit: Payer: Commercial Managed Care - PPO | Admitting: Internal Medicine

## 2019-11-14 ENCOUNTER — Ambulatory Visit (HOSPITAL_COMMUNITY)
Admission: RE | Admit: 2019-11-14 | Discharge: 2019-11-14 | Disposition: A | Discharge status: Home / Self Care | Payer: Commercial Managed Care - PPO | Source: Ambulatory Visit | Attending: Pulmonary Disease | Admitting: Pulmonary Disease

## 2019-11-14 DIAGNOSIS — U071 COVID-19: Secondary | ICD-10-CM | POA: Diagnosis present

## 2019-11-14 DIAGNOSIS — Z23 Encounter for immunization: Secondary | ICD-10-CM | POA: Diagnosis not present

## 2019-11-14 MED ORDER — SODIUM CHLORIDE 0.9 % IV SOLN: INTRAVENOUS | Status: DC | PRN

## 2019-11-14 MED ORDER — EPINEPHRINE 0.3 MG/0.3ML IJ SOAJ: 0.3000 mg | Freq: Once | INTRAMUSCULAR | Status: DC | PRN

## 2019-11-14 MED ORDER — METHYLPREDNISOLONE SODIUM SUCC 125 MG IJ SOLR: 125.0000 mg | Freq: Once | INTRAMUSCULAR | Status: DC | PRN

## 2019-11-14 MED ORDER — SODIUM CHLORIDE 0.9 % IV SOLN
700.0000 mg | Freq: Once | INTRAVENOUS | Status: AC
  Administered 2019-11-14: 09:00:00 700 mg via INTRAVENOUS
  Filled 2019-11-14: qty 20

## 2019-11-14 MED ORDER — FAMOTIDINE IN NACL 20-0.9 MG/50ML-% IV SOLN: 20.0000 mg | Freq: Once | INTRAVENOUS | Status: DC | PRN

## 2019-11-14 MED ORDER — ALBUTEROL SULFATE HFA 108 (90 BASE) MCG/ACT IN AERS: 2.0000 | INHALATION_SPRAY | Freq: Once | RESPIRATORY_TRACT | Status: DC | PRN

## 2019-11-14 MED ORDER — DIPHENHYDRAMINE HCL 50 MG/ML IJ SOLN: 50.0000 mg | Freq: Once | INTRAMUSCULAR | Status: DC | PRN

## 2019-11-14 NOTE — Progress Notes (Signed)
  Diagnosis: COVID-19  Physician: Wright  Procedure: [facility] Med: bamlanivimab infusion - Provided patient with bamlanimivab fact sheet for patients, parents and caregivers prior to infusion.  Complications: No immediate complications noted.  Discharge: Discharged home   Gaylin Bulthuis E 11/14/2019  

## 2019-11-14 NOTE — Discharge Instructions (Signed)

## 2019-11-15 ENCOUNTER — Other Ambulatory Visit: Payer: Self-pay

## 2019-11-15 ENCOUNTER — Telehealth (INDEPENDENT_AMBULATORY_CARE_PROVIDER_SITE_OTHER): Payer: Commercial Managed Care - PPO | Admitting: Internal Medicine

## 2019-11-15 DIAGNOSIS — U071 COVID-19: Secondary | ICD-10-CM | POA: Diagnosis not present

## 2019-11-15 NOTE — Progress Notes (Signed)
Virtual Visit via Telephone Note  I connected with Jim Green on 11/15/19 at  1:30 PM EST by telephone and verified that I am speaking with the correct person using two identifiers.   I discussed the limitations, risks, security and privacy concerns of performing an evaluation and management service by telephone and the availability of in person appointments. I also discussed with the patient that there may be a patient responsible charge related to this service. The patient expressed understanding and agreed to proceed.  Location patient: home Location provider: work office Participants present for the call: patient, provider Patient did not have a visit in the prior 7 days to address this/these issue(s).   History of Present Illness:  He has made this appointment to inform me that he has tested positive for COVID-19.  He did not have any symptoms or known exposures but for some reason his friends daughter scheduled an appointment for him.  He had monoclonal antibody infusion yesterday.  He is feeling well other than some minor URI symptoms like a runny nose and postnasal drip.  He is wondering what he can do about this.   Observations/Objective: Patient sounds cheerful and well on the phone. I do not appreciate any increased work of breathing. Speech and thought processing are grossly intact. Patient reported vitals: None reported   Current Outpatient Medications:  .  ibuprofen (ADVIL,MOTRIN) 600 MG tablet, Take 1 tablet (600 mg total) by mouth every 6 (six) hours as needed., Disp: 30 tablet, Rfl: 0 .  lisinopril-hydrochlorothiazide (ZESTORETIC) 20-25 MG tablet, TAKE 1 TABLET BY MOUTH ONCE DAILY, Disp: 90 tablet, Rfl: 1 .  metFORMIN (GLUCOPHAGE) 500 MG tablet, TAKE ONE TABLET BY MOUTH ONCE DAILY WITH&amp;nbsp;&amp;nbsp;BREAKFAST, Disp: 90 tablet, Rfl: 1 .  NEEDLE, DISP, 25 G (B-D DISP NEEDLE 25GX1") 25G X 1" MISC, Inject 1,000 mcg of B12 once a month for 6 months., Disp: 1  each, Rfl: 5 .  simvastatin (ZOCOR) 40 MG tablet, Take 1 tablet (40 mg total) by mouth daily with breakfast., Disp: 90 tablet, Rfl: 1 .  potassium chloride SA (KLOR-CON) 20 MEQ tablet, Take 2 tablets (40 mEq total) by mouth daily for 5 days., Disp: 10 tablet, Rfl: 0  Review of Systems:  Constitutional: Denies fever, chills, diaphoresis, appetite change and fatigue.  HEENT: Denies photophobia, eye pain, redness, hearing loss, ear pain,  sneezing, mouth sores, trouble swallowing, neck pain, neck stiffness and tinnitus.   Respiratory: Denies SOB, DOE, cough, chest tightness,  and wheezing.   Cardiovascular: Denies chest pain, palpitations and leg swelling.  Gastrointestinal: Denies nausea, vomiting, abdominal pain, diarrhea, constipation, blood in stool and abdominal distention.  Genitourinary: Denies dysuria, urgency, frequency, hematuria, flank pain and difficulty urinating.  Endocrine: Denies: hot or cold intolerance, sweats, changes in hair or nails, polyuria, polydipsia. Musculoskeletal: Denies myalgias, back pain, joint swelling, arthralgias and gait problem.  Skin: Denies pallor, rash and wound.  Neurological: Denies dizziness, seizures, syncope, weakness, light-headedness, numbness and headaches.  Hematological: Denies adenopathy. Easy bruising, personal or family bleeding history  Psychiatric/Behavioral: Denies suicidal ideation, mood changes, confusion, nervousness, sleep disturbance and agitation   Assessment and Plan:  COVID-19 -Advised mucinex BID, daily antihistamine and tylenol/ibuprofen as needed. -He will contact me if worsening symptoms.   I discussed the assessment and treatment plan with the patient. The patient was provided an opportunity to ask questions and all were answered. The patient agreed with the plan and demonstrated an understanding of the instructions.   The patient was  advised to call back or seek an in-person evaluation if the symptoms worsen or if the  condition fails to improve as anticipated.  I provided 12 minutes of non-face-to-face time during this encounter.   Chaya Jan, MD [facility] at [facility]

## 2019-11-23 ENCOUNTER — Other Ambulatory Visit: Payer: Commercial Managed Care - PPO

## 2019-11-23 ENCOUNTER — Other Ambulatory Visit: Payer: Self-pay

## 2019-11-27 ENCOUNTER — Other Ambulatory Visit: Payer: Self-pay

## 2019-11-27 ENCOUNTER — Other Ambulatory Visit: Payer: Commercial Managed Care - PPO

## 2019-11-29 ENCOUNTER — Other Ambulatory Visit: Payer: Self-pay

## 2019-11-29 ENCOUNTER — Other Ambulatory Visit (INDEPENDENT_AMBULATORY_CARE_PROVIDER_SITE_OTHER): Payer: Commercial Managed Care - PPO

## 2019-11-29 ENCOUNTER — Other Ambulatory Visit: Payer: Self-pay | Admitting: Internal Medicine

## 2019-11-29 DIAGNOSIS — E559 Vitamin D deficiency, unspecified: Secondary | ICD-10-CM

## 2019-11-29 LAB — VITAMIN D 25 HYDROXY (VIT D DEFICIENCY, FRACTURES): VITD: 22.79 ng/mL — ABNORMAL LOW (ref 30.00–100.00)

## 2019-11-29 MED ORDER — VITAMIN D (ERGOCALCIFEROL) 1.25 MG (50000 UNIT) PO CAPS: 50000.0000 [IU] | ORAL_CAPSULE | ORAL | 0 refills | Status: AC

## 2019-12-04 ENCOUNTER — Telehealth: Payer: Self-pay | Admitting: Internal Medicine

## 2019-12-04 NOTE — Telephone Encounter (Signed)
Pt tested positive on 11/09/19 and he had an infusion done on 11/14/19. He is wondering when is the best time to get retested and when is it ok to get the vaccine?   Pt can be reached at [phone]

## 2019-12-05 NOTE — Telephone Encounter (Signed)
Left message on machine for patient to return our call 

## 2019-12-05 NOTE — Telephone Encounter (Signed)
Spoke with patient.

## 2020-01-02 ENCOUNTER — Ambulatory Visit (INDEPENDENT_AMBULATORY_CARE_PROVIDER_SITE_OTHER): Payer: Commercial Managed Care - PPO | Admitting: *Deleted

## 2020-01-02 ENCOUNTER — Other Ambulatory Visit: Payer: Self-pay

## 2020-01-02 DIAGNOSIS — Z23 Encounter for immunization: Secondary | ICD-10-CM

## 2020-01-02 NOTE — Progress Notes (Signed)
Per orders of Lysle Morales NP, injection of Shingrix given by Kern Reap Patient tolerated injection well.

## 2020-01-03 ENCOUNTER — Telehealth (INDEPENDENT_AMBULATORY_CARE_PROVIDER_SITE_OTHER): Payer: Commercial Managed Care - PPO | Admitting: Internal Medicine

## 2020-01-03 DIAGNOSIS — R634 Abnormal weight loss: Secondary | ICD-10-CM | POA: Diagnosis not present

## 2020-01-03 DIAGNOSIS — R0982 Postnasal drip: Secondary | ICD-10-CM

## 2020-01-03 NOTE — Progress Notes (Signed)
Virtual Visit via Telephone Note  I connected with Jim Green on 01/03/20 at  1:15 PM EDT by telephone and verified that I am speaking with the correct person using two identifiers.   I discussed the limitations, risks, security and privacy concerns of performing an evaluation and management service by telephone and the availability of in person appointments. I also discussed with the patient that there may be a patient responsible charge related to this service. The patient expressed understanding and agreed to proceed.  Location patient: home Location provider: work office Participants present for the call: patient, provider Patient did not have a visit in the prior 7 days to address this/these issue(s).   History of Present Illness:  He has scheduled this visit to discuss some concerns with weight loss.  He has not weighed himself but has noticed his pants are becoming loose.  He has been eating less, does not have much of an appetite.  He weighed 260 pounds at his last visit.  He has not weighed himself at home recently but states he thinks he has lost around 40 to 50 pounds.  He also states he has had increase in EtOH use.  He has been drinking about 4-5 shots a day of brandy.  He has not noticed any black or red stools.  He has not checked his sugars lately.  He also wants to know what he can take over-the-counter for postnasal drip.   Observations/Objective: Patient sounds cheerful and well on the phone. I do not appreciate any increased work of breathing. Speech and thought processing are grossly intact. Patient reported vitals: None reported   Current Outpatient Medications:  .  ibuprofen (ADVIL,MOTRIN) 600 MG tablet, Take 1 tablet (600 mg total) by mouth every 6 (six) hours as needed., Disp: 30 tablet, Rfl: 0 .  lisinopril-hydrochlorothiazide (ZESTORETIC) 20-25 MG tablet, TAKE 1 TABLET BY MOUTH ONCE DAILY, Disp: 90 tablet, Rfl: 1 .  metFORMIN (GLUCOPHAGE) 500 MG tablet,  TAKE ONE TABLET BY MOUTH ONCE DAILY WITH  BREAKFAST, Disp: 90 tablet, Rfl: 1 .  NEEDLE, DISP, 25 G (B-D DISP NEEDLE 25GX1") 25G X 1" MISC, Inject 1,000 mcg of B12 once a month for 6 months., Disp: 1 each, Rfl: 5 .  potassium chloride SA (KLOR-CON) 20 MEQ tablet, Take 2 tablets (40 mEq total) by mouth daily for 5 days., Disp: 10 tablet, Rfl: 0 .  simvastatin (ZOCOR) 40 MG tablet, Take 1 tablet (40 mg total) by mouth daily with breakfast., Disp: 90 tablet, Rfl: 1 .  Vitamin D, Ergocalciferol, (DRISDOL) 1.25 MG (50000 UNIT) CAPS capsule, Take 1 capsule (50,000 Units total) by mouth every 7 (seven) days for 12 doses., Disp: 12 capsule, Rfl: 0  Review of Systems:  Constitutional: Denies fever, chills, diaphoresis, appetite change and fatigue.  HEENT: Denies photophobia, eye pain, redness, hearing loss, ear pain, congestion, sore throat, rhinorrhea, sneezing, mouth sores, trouble swallowing, neck pain, neck stiffness and tinnitus.   Respiratory: Denies SOB, DOE, cough, chest tightness,  and wheezing.   Cardiovascular: Denies chest pain, palpitations and leg swelling.  Gastrointestinal: Denies nausea, vomiting, abdominal pain, diarrhea, constipation, blood in stool and abdominal distention.  Genitourinary: Denies dysuria, urgency, frequency, hematuria, flank pain and difficulty urinating.  Endocrine: Denies: hot or cold intolerance, sweats, changes in hair or nails, polyuria, polydipsia. Musculoskeletal: Denies myalgias, back pain, joint swelling, arthralgias and gait problem.  Skin: Denies pallor, rash and wound.  Neurological: Denies dizziness, seizures, syncope, weakness, light-headedness, numbness and headaches.  Hematological: Denies adenopathy. Easy bruising, personal or family bleeding history  Psychiatric/Behavioral: Denies suicidal ideation, mood changes, confusion, nervousness, sleep disturbance and agitation   Assessment and Plan:  Weight loss -Hard to quantify weight loss history as we  do not have any objective data. -Suspect increased EtOH use may be playing a role, also wonder whether his diabetes has become uncontrolled. -I believe he needs to come to the office for an in person visit for formal exam, weight check and lab work.  He agrees.  Post-nasal drip -Advised use of over-the-counter Zyrtec daily and Mucinex twice daily. -Return to clinic if further issues.    I discussed the assessment and treatment plan with the patient. The patient was provided an opportunity to ask questions and all were answered. The patient agreed with the plan and demonstrated an understanding of the instructions.   The patient was advised to call back or seek an in-person evaluation if the symptoms worsen or if the condition fails to improve as anticipated.  I provided 22 minutes of non-face-to-face time during this encounter.   Lelon Frohlich, MD [facility] at [facility]

## 2020-01-09 ENCOUNTER — Ambulatory Visit: Payer: Self-pay | Admitting: Internal Medicine

## 2020-01-09 ENCOUNTER — Other Ambulatory Visit: Payer: Self-pay

## 2020-01-10 ENCOUNTER — Encounter: Payer: Self-pay | Admitting: Internal Medicine

## 2020-01-10 ENCOUNTER — Ambulatory Visit (INDEPENDENT_AMBULATORY_CARE_PROVIDER_SITE_OTHER): Payer: Commercial Managed Care - PPO | Admitting: Internal Medicine

## 2020-01-10 VITALS — BP 160/90 | HR 71 | Temp 97.7°F | Wt 258.2 lb

## 2020-01-10 DIAGNOSIS — I10 Essential (primary) hypertension: Secondary | ICD-10-CM

## 2020-01-10 DIAGNOSIS — E1169 Type 2 diabetes mellitus with other specified complication: Secondary | ICD-10-CM

## 2020-01-10 DIAGNOSIS — Z1211 Encounter for screening for malignant neoplasm of colon: Secondary | ICD-10-CM | POA: Diagnosis not present

## 2020-01-10 DIAGNOSIS — E785 Hyperlipidemia, unspecified: Secondary | ICD-10-CM

## 2020-01-10 DIAGNOSIS — E1165 Type 2 diabetes mellitus with hyperglycemia: Secondary | ICD-10-CM | POA: Diagnosis not present

## 2020-01-10 LAB — POCT GLYCOSYLATED HEMOGLOBIN (HGB A1C): Hemoglobin A1C: 5.7 % — AB (ref 4.0–5.6)

## 2020-01-10 NOTE — Progress Notes (Signed)
Established Patient Office Visit     This visit occurred during the SARS-CoV-2 public health emergency.  Safety protocols were in place, including screening questions prior to the visit, additional usage of staff PPE, and extensive cleaning of exam room while observing appropriate contact time as indicated for disinfecting solutions.    CC/Reason for Visit: Follow-up chronic conditions  HPI: Jim Green is a 59 y.o. male who is coming in today for the above mentioned reasons. Past Medical History is significant for: Hyperlipidemia, type 2 diabetes, hypertension, vitamin D and vitamin B12 deficiency.  We had a phone consultation recently and he thought he had lost 60 pounds since his last visit.  He has only lost 1.8 pounds since November 2020.  He is not trying to lose weight.  He is also here to follow-up on blood pressure and diabetes.  He has no complaints other than continued right knee pain from his osteoarthritis.   Past Medical/Surgical History: Past Medical History:  Diagnosis Date  . Allergy   . Diabetes mellitus without complication (HCC)   . Eczema   . ED (erectile dysfunction)   . Hyperlipidemia   . Hypertension   . Obesity   . Sleep apnea     Past Surgical History:  Procedure Laterality Date  . KNEE ARTHROSCOPY     right knee  . NASAL SINUS SURGERY      Social History:  reports that he has never smoked. He has never used smokeless tobacco. He reports current alcohol use of about 12.0 standard drinks of alcohol per week. He reports that he does not use drugs.  Allergies: No Known Allergies  Family History:  Family History  Problem Relation Age of Onset  . Heart disease Other   . Hypertension Other   . Obesity Other      Current Outpatient Medications:  .  ibuprofen (ADVIL,MOTRIN) 600 MG tablet, Take 1 tablet (600 mg total) by mouth every 6 (six) hours as needed., Disp: 30 tablet, Rfl: 0 .  lisinopril-hydrochlorothiazide (ZESTORETIC) 20-25 MG  tablet, TAKE 1 TABLET BY MOUTH ONCE DAILY, Disp: 90 tablet, Rfl: 1 .  metFORMIN (GLUCOPHAGE) 500 MG tablet, TAKE ONE TABLET BY MOUTH ONCE DAILY WITH&amp;nbsp;&amp;nbsp;BREAKFAST, Disp: 90 tablet, Rfl: 1 .  NEEDLE, DISP, 25 G (B-D DISP NEEDLE 25GX1") 25G X 1" MISC, Inject 1,000 mcg of B12 once a month for 6 months., Disp: 1 each, Rfl: 5 .  simvastatin (ZOCOR) 40 MG tablet, Take 1 tablet (40 mg total) by mouth daily with breakfast., Disp: 90 tablet, Rfl: 1 .  Vitamin D, Ergocalciferol, (DRISDOL) 1.25 MG (50000 UNIT) CAPS capsule, Take 1 capsule (50,000 Units total) by mouth every 7 (seven) days for 12 doses., Disp: 12 capsule, Rfl: 0 .  potassium chloride SA (KLOR-CON) 20 MEQ tablet, Take 2 tablets (40 mEq total) by mouth daily for 5 days., Disp: 10 tablet, Rfl: 0  Review of Systems:  Constitutional: Denies fever, chills, diaphoresis, appetite change and fatigue.  HEENT: Denies photophobia, eye pain, redness, hearing loss, ear pain, congestion, sore throat, rhinorrhea, sneezing, mouth sores, trouble swallowing, neck pain, neck stiffness and tinnitus.   Respiratory: Denies SOB, DOE, cough, chest tightness,  and wheezing.   Cardiovascular: Denies chest pain, palpitations and leg swelling.  Gastrointestinal: Denies nausea, vomiting, abdominal pain, diarrhea, constipation, blood in stool and abdominal distention.  Genitourinary: Denies dysuria, urgency, frequency, hematuria, flank pain and difficulty urinating.  Endocrine: Denies: hot or cold intolerance, sweats, changes in hair or nails, polyuria,  polydipsia. Musculoskeletal: Denies myalgias, back pain, joint swelling, arthralgias and gait problem.  Skin: Denies pallor, rash and wound.  Neurological: Denies dizziness, seizures, syncope, weakness, light-headedness, numbness and headaches.  Hematological: Denies adenopathy. Easy bruising, personal or family bleeding history  Psychiatric/Behavioral: Denies suicidal ideation, mood changes, confusion,  nervousness, sleep disturbance and agitation    Physical Exam: Vitals:   01/10/20 0920  BP: (!) 160/90  Pulse: 71  Temp: 97.7 F (36.5 C)  TempSrc: Temporal  SpO2: 98%  Weight: 258 lb 3.2 oz (117.1 kg)    Body mass index is 39.26 kg/m.   Constitutional: NAD, calm, comfortable Eyes: PERRL, lids and conjunctivae normal ENMT: Mucous membranes are moist.  Respiratory: clear to auscultation bilaterally, no wheezing, no crackles. Normal respiratory effort. No accessory muscle use.  Cardiovascular: Regular rate and rhythm, no murmurs / rubs / gallops. No extremity edema. Neurologic: Grossly intact and nonfocal Psychiatric: Normal judgment and insight. Alert and oriented x 3. Normal mood.    Impression and Plan:  Screening for malignant neoplasm of colon  - Plan: Ambulatory referral to Gastroenterology  Uncontrolled type 2 diabetes mellitus with hyperglycemia ([place]) -A1c in office of 5.7 demonstrates excellent control, continue Metformin.  Hyperlipidemia associated with type 2 diabetes mellitus ([place]) -Last LDL was 90 in 2020, continue simvastatin.  Essential hypertension -Uncontrolled with a blood pressure of 160/90 in office. -Advised low-salt diet, medication compliance, ambulatory blood pressure measurements. -He will return in 6 weeks for follow-up.   Patient Instructions  -Nice seeing you today!!  -Check blood pressure at home 2-3 times a week and bring numbers into your next visit in 6 weeks.  -6 week follow up.     Lelon Frohlich, MD [facility] at [facility]

## 2020-01-10 NOTE — Patient Instructions (Signed)
-  Nice seeing you today!!  -Check blood pressure at home 2-3 times a week and bring numbers into your next visit in 6 weeks.  -6 week follow up.

## 2020-02-19 ENCOUNTER — Encounter: Payer: Self-pay | Admitting: Internal Medicine

## 2020-02-21 ENCOUNTER — Ambulatory Visit (INDEPENDENT_AMBULATORY_CARE_PROVIDER_SITE_OTHER): Payer: Commercial Managed Care - PPO | Admitting: Internal Medicine

## 2020-02-21 ENCOUNTER — Encounter: Payer: Self-pay | Admitting: Internal Medicine

## 2020-02-21 ENCOUNTER — Other Ambulatory Visit: Payer: Self-pay

## 2020-02-21 ENCOUNTER — Telehealth: Payer: Self-pay | Admitting: Internal Medicine

## 2020-02-21 VITALS — BP 180/100 | HR 67 | Temp 98.0°F | Ht 68.0 in | Wt 254.3 lb

## 2020-02-21 DIAGNOSIS — E1169 Type 2 diabetes mellitus with other specified complication: Secondary | ICD-10-CM

## 2020-02-21 DIAGNOSIS — E118 Type 2 diabetes mellitus with unspecified complications: Secondary | ICD-10-CM | POA: Diagnosis not present

## 2020-02-21 DIAGNOSIS — I10 Essential (primary) hypertension: Secondary | ICD-10-CM | POA: Diagnosis not present

## 2020-02-21 DIAGNOSIS — E785 Hyperlipidemia, unspecified: Secondary | ICD-10-CM

## 2020-02-21 MED ORDER — LISINOPRIL 40 MG PO TABS: 40.0000 mg | ORAL_TABLET | Freq: Every day | ORAL | 1 refills | Status: DC

## 2020-02-21 MED ORDER — METFORMIN HCL 500 MG PO TABS: ORAL_TABLET | ORAL | 1 refills | Status: DC

## 2020-02-21 MED ORDER — HYDROCHLOROTHIAZIDE 25 MG PO TABS: 25.0000 mg | ORAL_TABLET | Freq: Every day | ORAL | 1 refills | Status: DC

## 2020-02-21 MED ORDER — SIMVASTATIN 40 MG PO TABS: 40.0000 mg | ORAL_TABLET | Freq: Every day | ORAL | 1 refills | Status: DC

## 2020-02-21 NOTE — Patient Instructions (Signed)
-Nice seeing you today!!  -Stop lisinopril/HCT  -Start HCTZ 25 mg daily and lisinopril 40 mg daily.  -Low salt diet (see below).  -Schedule follow up in 6 weeks. Bring your BP machine to your next visit.   DASH Eating Plan DASH stands for "Dietary Approaches to Stop Hypertension." The DASH eating plan is a healthy eating plan that has been shown to reduce high blood pressure (hypertension). It may also reduce your risk for type 2 diabetes, heart disease, and stroke. The DASH eating plan may also help with weight loss. What are tips for following this plan?  General guidelines  Avoid eating more than 2,300 mg (milligrams) of salt (sodium) a day. If you have hypertension, you may need to reduce your sodium intake to 1,500 mg a day.  Limit alcohol intake to no more than 1 drink a day for nonpregnant women and 2 drinks a day for men. One drink equals 12 oz of beer, 5 oz of wine, or 1 oz of hard liquor.  Work with your health care provider to maintain a healthy body weight or to lose weight. Ask what an ideal weight is for you.  Get at least 30 minutes of exercise that causes your heart to beat faster (aerobic exercise) most days of the week. Activities may include walking, swimming, or biking.  Work with your health care provider or diet and nutrition specialist (dietitian) to adjust your eating plan to your individual calorie needs. Reading food labels   Check food labels for the amount of sodium per serving. Choose foods with less than 5 percent of the Daily Value of sodium. Generally, foods with less than 300 mg of sodium per serving fit into this eating plan.  To find whole grains, look for the word "whole" as the first word in the ingredient list. Shopping  Buy products labeled as "low-sodium" or "no salt added."  Buy fresh foods. Avoid canned foods and premade or frozen meals. Cooking  Avoid adding salt when cooking. Use salt-free seasonings or herbs instead of table salt or  sea salt. Check with your health care provider or pharmacist before using salt substitutes.  Do not fry foods. Cook foods using healthy methods such as baking, boiling, grilling, and broiling instead.  Cook with heart-healthy oils, such as olive, canola, soybean, or sunflower oil. Meal planning  Eat a balanced diet that includes: ? 5 or more servings of fruits and vegetables each day. At each meal, try to fill half of your plate with fruits and vegetables. ? Up to 6-8 servings of whole grains each day. ? Less than 6 oz of lean meat, poultry, or fish each day. A 3-oz serving of meat is about the same size as a deck of cards. One egg equals 1 oz. ? 2 servings of low-fat dairy each day. ? A serving of nuts, seeds, or beans 5 times each week. ? Heart-healthy fats. Healthy fats called Omega-3 fatty acids are found in foods such as flaxseeds and coldwater fish, like sardines, salmon, and mackerel.  Limit how much you eat of the following: ? Canned or prepackaged foods. ? Food that is high in trans fat, such as fried foods. ? Food that is high in saturated fat, such as fatty meat. ? Sweets, desserts, sugary drinks, and other foods with added sugar. ? Full-fat dairy products.  Do not salt foods before eating.  Try to eat at least 2 vegetarian meals each week.  Eat more home-cooked food and less restaurant, buffet,  and fast food.  When eating at a restaurant, ask that your food be prepared with less salt or no salt, if possible. What foods are recommended? The items listed may not be a complete list. Talk with your dietitian about what dietary choices are best for you. Grains Whole-grain or whole-wheat bread. Whole-grain or whole-wheat pasta. Brown rice. [place]. Bulgur. Whole-grain and low-sodium cereals. Pita bread. Low-fat, low-sodium crackers. Whole-wheat flour tortillas. Vegetables Fresh or frozen vegetables (raw, steamed, roasted, or grilled). Low-sodium or reduced-sodium  tomato and vegetable juice. Low-sodium or reduced-sodium tomato sauce and tomato paste. Low-sodium or reduced-sodium canned vegetables. Fruits All fresh, dried, or frozen fruit. Canned fruit in natural juice (without added sugar). Meat and other protein foods Skinless chicken or [place]. Ground chicken or [place]. Pork with fat trimmed off. Fish and seafood. Egg whites. Dried beans, peas, or lentils. Unsalted nuts, nut butters, and seeds. Unsalted canned beans. Lean cuts of beef with fat trimmed off. Low-sodium, lean deli meat. Dairy Low-fat (1%) or fat-free (skim) milk. Fat-free, low-fat, or reduced-fat cheeses. Nonfat, low-sodium ricotta or cottage cheese. Low-fat or nonfat yogurt. Low-fat, low-sodium cheese. Fats and oils Soft margarine without trans fats. Vegetable oil. Low-fat, reduced-fat, or light mayonnaise and salad dressings (reduced-sodium). Canola, safflower, olive, soybean, and sunflower oils. Avocado. Seasoning and other foods Herbs. Spices. Seasoning mixes without salt. Unsalted popcorn and pretzels. Fat-free sweets. What foods are not recommended? The items listed may not be a complete list. Talk with your dietitian about what dietary choices are best for you. Grains Baked goods made with fat, such as croissants, muffins, or some breads. Dry pasta or rice meal packs. Vegetables Creamed or fried vegetables. Vegetables in a cheese sauce. Regular canned vegetables (not low-sodium or reduced-sodium). Regular canned tomato sauce and paste (not low-sodium or reduced-sodium). Regular tomato and vegetable juice (not low-sodium or reduced-sodium). Angie Fava. Olives. Fruits Canned fruit in a light or heavy syrup. Fried fruit. Fruit in cream or butter sauce. Meat and other protein foods Fatty cuts of meat. Ribs. Fried meat. Berniece Salines. Sausage. Bologna and other processed lunch meats. Salami. Fatback. Hotdogs. Bratwurst. Salted nuts and seeds. Canned beans with added salt. Canned or smoked fish.  Whole eggs or egg yolks. Chicken or [place] with skin. Dairy Whole or 2% milk, cream, and half-and-half. Whole or full-fat cream cheese. Whole-fat or sweetened yogurt. Full-fat cheese. Nondairy creamers. Whipped toppings. Processed cheese and cheese spreads. Fats and oils Butter. Stick margarine. Lard. Shortening. Ghee. Bacon fat. Tropical oils, such as coconut, palm kernel, or palm oil. Seasoning and other foods Salted popcorn and pretzels. Onion salt, garlic salt, seasoned salt, table salt, and sea salt. Worcestershire sauce. Tartar sauce. Barbecue sauce. Teriyaki sauce. Soy sauce, including reduced-sodium. Steak sauce. Canned and packaged gravies. Fish sauce. Oyster sauce. Cocktail sauce. Horseradish that you find on the shelf. Ketchup. Mustard. Meat flavorings and tenderizers. Bouillon cubes. Hot sauce and Tabasco sauce. Premade or packaged marinades. Premade or packaged taco seasonings. Relishes. Regular salad dressings. Where to find more information:  National Heart, Lung, and [place]: https://wilson-eaton.com/  American Heart Association: www.heart.org Summary  The DASH eating plan is a healthy eating plan that has been shown to reduce high blood pressure (hypertension). It may also reduce your risk for type 2 diabetes, heart disease, and stroke.  With the DASH eating plan, you should limit salt (sodium) intake to 2,300 mg a day. If you have hypertension, you may need to reduce your sodium intake to 1,500 mg a day.  When on  the DASH eating plan, aim to eat more fresh fruits and vegetables, whole grains, lean proteins, low-fat dairy, and heart-healthy fats.  Work with your health care provider or diet and nutrition specialist (dietitian) to adjust your eating plan to your individual calorie needs. This information is not intended to replace advice given to you by your health care provider. Make sure you discuss any questions you have with your health care provider. Document Revised:  09/17/2017 Document Reviewed: 09/28/2016 Elsevier Patient Education  2020 [employer].

## 2020-02-21 NOTE — Progress Notes (Signed)
Established Patient Office Visit     This visit occurred during the SARS-CoV-2 public health emergency.  Safety protocols were in place, including screening questions prior to the visit, additional usage of staff PPE, and extensive cleaning of exam room while observing appropriate contact time as indicated for disinfecting solutions.    CC/Reason for Visit: Blood pressure follow-up  HPI: Jim Green is a 59 y.o. male who is coming in today for the above mentioned reasons. Past Medical History is significant for: Hyperlipidemia, type 2 diabetes, hypertension, vitamin D and vitamin B12 deficiency. He was seen on March 24 and at that time was noticed to have an elevated blood pressure to 160/90.  He was advised to do ambulatory home measurements and return in 6 weeks for follow-up.  Blood pressure in office today is 180/100 confirmed on 2 separate measurements.  He states that this correlates with his home measurements.  He denies chest pain, shortness of breath, double or blurry vision or any focal neurologic deficits.   Past Medical/Surgical History: Past Medical History:  Diagnosis Date  . Allergy   . Diabetes mellitus without complication (HCC)   . Eczema   . ED (erectile dysfunction)   . Hyperlipidemia   . Hypertension   . Obesity   . Sleep apnea     Past Surgical History:  Procedure Laterality Date  . KNEE ARTHROSCOPY     right knee  . NASAL SINUS SURGERY      Social History:  reports that he has never smoked. He has never used smokeless tobacco. He reports current alcohol use of about 12.0 standard drinks of alcohol per week. He reports that he does not use drugs.  Allergies: No Known Allergies  Family History:  Family History  Problem Relation Age of Onset  . Heart disease Other   . Hypertension Other   . Obesity Other      Current Outpatient Medications:  .  ibuprofen (ADVIL,MOTRIN) 600 MG tablet, Take 1 tablet (600 mg total) by mouth every 6 (six)  hours as needed., Disp: 30 tablet, Rfl: 0 .  metFORMIN (GLUCOPHAGE) 500 MG tablet, TAKE ONE TABLET BY MOUTH ONCE DAILY WITH&amp;nbsp;&amp;nbsp;BREAKFAST, Disp: 90 tablet, Rfl: 1 .  NEEDLE, DISP, 25 G (B-D DISP NEEDLE 25GX1") 25G X 1" MISC, Inject 1,000 mcg of B12 once a month for 6 months., Disp: 1 each, Rfl: 5 .  simvastatin (ZOCOR) 40 MG tablet, Take 1 tablet (40 mg total) by mouth daily with breakfast., Disp: 90 tablet, Rfl: 1 .  hydrochlorothiazide (HYDRODIURIL) 25 MG tablet, Take 1 tablet (25 mg total) by mouth daily., Disp: 90 tablet, Rfl: 1 .  lisinopril (ZESTRIL) 40 MG tablet, Take 1 tablet (40 mg total) by mouth daily., Disp: 90 tablet, Rfl: 1 .  potassium chloride SA (KLOR-CON) 20 MEQ tablet, Take 2 tablets (40 mEq total) by mouth daily for 5 days., Disp: 10 tablet, Rfl: 0  Review of Systems:  Constitutional: Denies fever, chills, diaphoresis, appetite change and fatigue.  HEENT: Denies photophobia, eye pain, redness, hearing loss, ear pain, congestion, sore throat, rhinorrhea, sneezing, mouth sores, trouble swallowing, neck pain, neck stiffness and tinnitus.   Respiratory: Denies SOB, DOE, cough, chest tightness,  and wheezing.   Cardiovascular: Denies chest pain, palpitations and leg swelling.  Gastrointestinal: Denies nausea, vomiting, abdominal pain, diarrhea, constipation, blood in stool and abdominal distention.  Genitourinary: Denies dysuria, urgency, frequency, hematuria, flank pain and difficulty urinating.  Endocrine: Denies: hot or cold intolerance, sweats, changes in  hair or nails, polyuria, polydipsia. Musculoskeletal: Denies myalgias, back pain, joint swelling, arthralgias and gait problem.  Skin: Denies pallor, rash and wound.  Neurological: Denies dizziness, seizures, syncope, weakness, light-headedness, numbness and headaches.  Hematological: Denies adenopathy. Easy bruising, personal or family bleeding history  Psychiatric/Behavioral: Denies suicidal ideation, mood  changes, confusion, nervousness, sleep disturbance and agitation    Physical Exam: Vitals:   02/21/20 1001  BP: (!) 180/100  Pulse: 67  Temp: 98 F (36.7 C)  TempSrc: Temporal  SpO2: 99%  Weight: 254 lb 4.8 oz (115.3 kg)  Height: 5\' 8"  (1.727 m)    Body mass index is 38.67 kg/m.   Constitutional: NAD, calm, comfortable Eyes: PERRL, lids and conjunctivae normal ENMT: Mucous membranes are moist.  Respiratory: clear to auscultation bilaterally, no wheezing, no crackles. Normal respiratory effort. No accessory muscle use.  Cardiovascular: Regular rate and rhythm, no murmurs / rubs / gallops. No extremity edema. Neurologic: Grossly intact and nonfocal.   Psychiatric: Normal judgment and insight. Alert and oriented x 3. Normal mood.    Impression and Plan:  Essential hypertension -Uncontrolled.  Increase lisinopril from 20 to 40 mg, keep hydrochlorothiazide 25 mg. -He will return in 6 weeks for follow-up.  He has been advised to do ambulatory measurements and bring in his cuff at next visit. -Consider basic metabolic profile when he returns.  DM type 2, controlled, with complication (HCC) -Last A1c was 5.7 in March 2021.  Hyperlipidemia associated with type 2 diabetes mellitus (HCC)  - Plan: simvastatin (ZOCOR) 40 MG tablet    Patient Instructions  -Nice seeing you today!!  -Stop lisinopril/HCT  -Start HCTZ 25 mg daily and lisinopril 40 mg daily.  -Low salt diet (see below).  -Schedule follow up in 6 weeks. Bring your BP machine to your next visit.   DASH Eating Plan DASH stands for "Dietary Approaches to Stop Hypertension." The DASH eating plan is a healthy eating plan that has been shown to reduce high blood pressure (hypertension). It may also reduce your risk for type 2 diabetes, heart disease, and stroke. The DASH eating plan may also help with weight loss. What are tips for following this plan?  General guidelines  Avoid eating more than 2,300 mg  (milligrams) of salt (sodium) a day. If you have hypertension, you may need to reduce your sodium intake to 1,500 mg a day.  Limit alcohol intake to no more than 1 drink a day for nonpregnant women and 2 drinks a day for men. One drink equals 12 oz of beer, 5 oz of wine, or 1 oz of hard liquor.  Work with your health care provider to maintain a healthy body weight or to lose weight. Ask what an ideal weight is for you.  Get at least 30 minutes of exercise that causes your heart to beat faster (aerobic exercise) most days of the week. Activities may include walking, swimming, or biking.  Work with your health care provider or diet and nutrition specialist (dietitian) to adjust your eating plan to your individual calorie needs. Reading food labels   Check food labels for the amount of sodium per serving. Choose foods with less than 5 percent of the Daily Value of sodium. Generally, foods with less than 300 mg of sodium per serving fit into this eating plan.  To find whole grains, look for the word "whole" as the first word in the ingredient list. Shopping  Buy products labeled as "low-sodium" or "no salt added."  Buy fresh foods.  Avoid canned foods and premade or frozen meals. Cooking  Avoid adding salt when cooking. Use salt-free seasonings or herbs instead of table salt or sea salt. Check with your health care provider or pharmacist before using salt substitutes.  Do not fry foods. Cook foods using healthy methods such as baking, boiling, grilling, and broiling instead.  Cook with heart-healthy oils, such as olive, canola, soybean, or sunflower oil. Meal planning  Eat a balanced diet that includes: ? 5 or more servings of fruits and vegetables each day. At each meal, try to fill half of your plate with fruits and vegetables. ? Up to 6-8 servings of whole grains each day. ? Less than 6 oz of lean meat, poultry, or fish each day. A 3-oz serving of meat is about the same size as a deck  of cards. One egg equals 1 oz. ? 2 servings of low-fat dairy each day. ? A serving of nuts, seeds, or beans 5 times each week. ? Heart-healthy fats. Healthy fats called Omega-3 fatty acids are found in foods such as flaxseeds and coldwater fish, like sardines, salmon, and mackerel.  Limit how much you eat of the following: ? Canned or prepackaged foods. ? Food that is high in trans fat, such as fried foods. ? Food that is high in saturated fat, such as fatty meat. ? Sweets, desserts, sugary drinks, and other foods with added sugar. ? Full-fat dairy products.  Do not salt foods before eating.  Try to eat at least 2 vegetarian meals each week.  Eat more home-cooked food and less restaurant, buffet, and fast food.  When eating at a restaurant, ask that your food be prepared with less salt or no salt, if possible. What foods are recommended? The items listed may not be a complete list. Talk with your dietitian about what dietary choices are best for you. Grains Whole-grain or whole-wheat bread. Whole-grain or whole-wheat pasta. Brown rice. Orpah Cobb. Bulgur. Whole-grain and low-sodium cereals. Pita bread. Low-fat, low-sodium crackers. Whole-wheat flour tortillas. Vegetables Fresh or frozen vegetables (raw, steamed, roasted, or grilled). Low-sodium or reduced-sodium tomato and vegetable juice. Low-sodium or reduced-sodium tomato sauce and tomato paste. Low-sodium or reduced-sodium canned vegetables. Fruits All fresh, dried, or frozen fruit. Canned fruit in natural juice (without added sugar). Meat and other protein foods Skinless chicken or [place]. Ground chicken or [place]. Pork with fat trimmed off. Fish and seafood. Egg whites. Dried beans, peas, or lentils. Unsalted nuts, nut butters, and seeds. Unsalted canned beans. Lean cuts of beef with fat trimmed off. Low-sodium, lean deli meat. Dairy Low-fat (1%) or fat-free (skim) milk. Fat-free, low-fat, or reduced-fat cheeses. Nonfat,  low-sodium ricotta or cottage cheese. Low-fat or nonfat yogurt. Low-fat, low-sodium cheese. Fats and oils Soft margarine without trans fats. Vegetable oil. Low-fat, reduced-fat, or light mayonnaise and salad dressings (reduced-sodium). Canola, safflower, olive, soybean, and sunflower oils. Avocado. Seasoning and other foods Herbs. Spices. Seasoning mixes without salt. Unsalted popcorn and pretzels. Fat-free sweets. What foods are not recommended? The items listed may not be a complete list. Talk with your dietitian about what dietary choices are best for you. Grains Baked goods made with fat, such as croissants, muffins, or some breads. Dry pasta or rice meal packs. Vegetables Creamed or fried vegetables. Vegetables in a cheese sauce. Regular canned vegetables (not low-sodium or reduced-sodium). Regular canned tomato sauce and paste (not low-sodium or reduced-sodium). Regular tomato and vegetable juice (not low-sodium or reduced-sodium). Rosita Fire. Olives. Fruits Canned fruit in a light or heavy  syrup. Maceo Pro fruit. Fruit in cream or butter sauce. Meat and other protein foods Fatty cuts of meat. Ribs. Fried meat. Berniece Salines. Sausage. Bologna and other processed lunch meats. Salami. Fatback. Hotdogs. Bratwurst. Salted nuts and seeds. Canned beans with added salt. Canned or smoked fish. Whole eggs or egg yolks. Chicken or [place] with skin. Dairy Whole or 2% milk, cream, and half-and-half. Whole or full-fat cream cheese. Whole-fat or sweetened yogurt. Full-fat cheese. Nondairy creamers. Whipped toppings. Processed cheese and cheese spreads. Fats and oils Butter. Stick margarine. Lard. Shortening. Ghee. Bacon fat. Tropical oils, such as coconut, palm kernel, or palm oil. Seasoning and other foods Salted popcorn and pretzels. Onion salt, garlic salt, seasoned salt, table salt, and sea salt. Worcestershire sauce. Tartar sauce. Barbecue sauce. Teriyaki sauce. Soy sauce, including reduced-sodium. Steak sauce.  Canned and packaged gravies. Fish sauce. Oyster sauce. Cocktail sauce. Horseradish that you find on the shelf. Ketchup. Mustard. Meat flavorings and tenderizers. Bouillon cubes. Hot sauce and Tabasco sauce. Premade or packaged marinades. Premade or packaged taco seasonings. Relishes. Regular salad dressings. Where to find more information:  National Heart, Lung, and [place]: https://wilson-eaton.com/  American Heart Association: www.heart.org Summary  The DASH eating plan is a healthy eating plan that has been shown to reduce high blood pressure (hypertension). It may also reduce your risk for type 2 diabetes, heart disease, and stroke.  With the DASH eating plan, you should limit salt (sodium) intake to 2,300 mg a day. If you have hypertension, you may need to reduce your sodium intake to 1,500 mg a day.  When on the DASH eating plan, aim to eat more fresh fruits and vegetables, whole grains, lean proteins, low-fat dairy, and heart-healthy fats.  Work with your health care provider or diet and nutrition specialist (dietitian) to adjust your eating plan to your individual calorie needs. This information is not intended to replace advice given to you by your health care provider. Make sure you discuss any questions you have with your health care provider. Document Revised: 09/17/2017 Document Reviewed: 09/28/2016 Elsevier Patient Education  2020 [place], MD [facility]

## 2020-02-21 NOTE — Telephone Encounter (Signed)
Jazz from [employer] is needing clarification on instructions for pt Metformin.

## 2020-02-21 NOTE — Telephone Encounter (Signed)
New prescription sent with corrected directions.

## 2020-03-12 ENCOUNTER — Other Ambulatory Visit: Payer: Self-pay | Admitting: Internal Medicine

## 2020-04-07 ENCOUNTER — Other Ambulatory Visit: Payer: Self-pay | Admitting: Internal Medicine

## 2020-06-12 ENCOUNTER — Other Ambulatory Visit: Payer: Self-pay

## 2020-06-12 ENCOUNTER — Ambulatory Visit: Payer: Commercial Managed Care - PPO | Admitting: Family Medicine

## 2020-06-12 ENCOUNTER — Encounter: Payer: Self-pay | Admitting: Family Medicine

## 2020-06-12 ENCOUNTER — Ambulatory Visit (INDEPENDENT_AMBULATORY_CARE_PROVIDER_SITE_OTHER)
Admission: RE | Admit: 2020-06-12 | Discharge: 2020-06-12 | Disposition: A | Discharge status: Home / Self Care | Payer: Commercial Managed Care - PPO | Source: Ambulatory Visit | Attending: Family Medicine | Admitting: Family Medicine

## 2020-06-12 VITALS — BP 160/90 | HR 78 | Temp 98.7°F | Wt 255.6 lb

## 2020-06-12 DIAGNOSIS — M7712 Lateral epicondylitis, left elbow: Secondary | ICD-10-CM | POA: Diagnosis not present

## 2020-06-12 DIAGNOSIS — R0781 Pleurodynia: Secondary | ICD-10-CM | POA: Diagnosis not present

## 2020-06-12 MED ORDER — DICLOFENAC SODIUM 75 MG PO TBEC: 75.0000 mg | DELAYED_RELEASE_TABLET | Freq: Two times a day (BID) | ORAL | 0 refills | Status: DC

## 2020-06-12 NOTE — Progress Notes (Signed)
   Subjective:    Patient ID: Jim Green, male    DOB: May 15, 1961, 59 y.o.   MRN: [id number]  HPI Here for 2 weeks of constant sharp pain in the left rib area. It is worst when sitting or lying down, better with standing or walking. No recent trauma. No cough or SOB. No change in bowel or bladder habits. Advil did not help. Also for several weeks he has had pain in the left elbow. Again no hx of trauma.  Review of Systems  Constitutional: Negative.   Respiratory: Negative.   Cardiovascular: Negative.   Gastrointestinal: Negative.   Genitourinary: Negative.   Musculoskeletal: Positive for arthralgias and back pain.       Objective:   Physical Exam Constitutional:      General: He is not in acute distress.    Appearance: Normal appearance.  Cardiovascular:     Rate and Rhythm: Normal rate and regular rhythm.     Pulses: Normal pulses.     Heart sounds: Normal heart sounds.  Pulmonary:     Effort: Pulmonary effort is normal.     Breath sounds: Normal breath sounds.  Abdominal:     General: Abdomen is flat. Bowel sounds are normal. There is no distension.     Palpations: Abdomen is soft. There is no mass.     Tenderness: There is no abdominal tenderness. There is no right CVA tenderness, left CVA tenderness, guarding or rebound.     Hernia: No hernia is present.  Musculoskeletal:     Comments: He is very tender over the left lateral ribs at the inferior margin. No crepitus. He is also tender over the left lateral epicondyle of the elbow. No swelling. Full ROM   Neurological:     Mental Status: He is alert.           Assessment & Plan:  The etiology of the rib pain is not clear. He will try Diclofenac for pain relief, and we will send him for rib Xrays. For the epicondylitis, he can ice the area and the Diclofenac should help this as well.  Gershon Crane, MD

## 2020-06-24 ENCOUNTER — Ambulatory Visit (HOSPITAL_COMMUNITY): Payer: Commercial Managed Care - PPO

## 2020-06-24 ENCOUNTER — Encounter (HOSPITAL_COMMUNITY): Payer: Self-pay | Admitting: Emergency Medicine

## 2020-06-24 ENCOUNTER — Ambulatory Visit (INDEPENDENT_AMBULATORY_CARE_PROVIDER_SITE_OTHER): Payer: Commercial Managed Care - PPO

## 2020-06-24 ENCOUNTER — Ambulatory Visit (HOSPITAL_COMMUNITY)
Admission: EM | Admit: 2020-06-24 | Discharge: 2020-06-24 | Disposition: A | Discharge status: Home / Self Care | Payer: Commercial Managed Care - PPO | Attending: Family Medicine | Admitting: Family Medicine

## 2020-06-24 ENCOUNTER — Other Ambulatory Visit: Payer: Self-pay

## 2020-06-24 DIAGNOSIS — M25512 Pain in left shoulder: Secondary | ICD-10-CM | POA: Diagnosis not present

## 2020-06-24 MED ORDER — PREDNISONE 20 MG PO TABS: 40.0000 mg | ORAL_TABLET | Freq: Every day | ORAL | 0 refills | Status: DC

## 2020-06-24 MED ORDER — CYCLOBENZAPRINE HCL 5 MG PO TABS: 5.0000 mg | ORAL_TABLET | Freq: Three times a day (TID) | ORAL | 0 refills | Status: DC | PRN

## 2020-06-24 NOTE — ED Triage Notes (Signed)
Patient presents to [facility] for assessment of 24 hours of left shoulder pain that he woke up to yesterday.  States pain has just worsened over time.  Patient states he tried one of his narcotics for his back without any relief.

## 2020-06-24 NOTE — Discharge Instructions (Addendum)
Your shoulder x-ray came back normal, so I suspect your pain is more muscular in nature. You can apply heating pads and topical gels like icy hot or biofreeze, and take the medications as prescribed. Make sure to be stretching and moving the shoulder around as you are able. Follow up with your primary care provider if not getting better

## 2020-06-24 NOTE — ED Provider Notes (Signed)
[facility]    CSN: [id number] Arrival date & time: 06/24/20  1227      History   Chief Complaint Chief Complaint  Patient presents with  . Shoulder Pain    HPI Jim Green is a 59 y.o. male.   Woke up to 8/10 sharp shoulder pain yesterday morning, worsening since onset and very stiff. Pain is exacerbated with movement, localized to shoulder joint, mildly relieved by rest. Taking OTC pain relievers without benefit. No numbness or tingling down arm, hx of neck issues, recent injuries, skin changes. Does have a hx of arthritis in his knees, has never had shoulder issues he's aware of.       Past Medical History:  Diagnosis Date  . Allergy   . Diabetes mellitus without complication (HCC)   . Eczema   . ED (erectile dysfunction)   . Hyperlipidemia   . Hypertension   . Obesity   . Sleep apnea     Patient Active Problem List   Diagnosis Date Noted  . Vitamin D deficiency 08/17/2019  . Vitamin B12 deficiency 08/17/2019  . Viral URI with cough 06/27/2018  . Mild intermittent asthma without complication 06/27/2018  . Abdominal pain 09/29/2016  . Osteoarthritis of both knees 03/30/2016  . Benign paroxysmal positional vertigo 07/02/2015  . Diabetes mellitus type II, uncontrolled (HCC) 01/31/2014  . Back pain, chronic 08/29/2012  . Atherosclerosis of aorta (HCC) 08/29/2012  . DYSHIDROSIS 12/17/2009  . Hyperlipidemia associated with type 2 diabetes mellitus (HCC) 10/09/2009  . ERECTILE DYSFUNCTION 06/26/2008  . UMBILICAL HERNIA 06/26/2008  . Essential hypertension 06/24/2007    Past Surgical History:  Procedure Laterality Date  . KNEE ARTHROSCOPY     right knee  . NASAL SINUS SURGERY         Home Medications    Prior to Admission medications   Medication Sig Start Date End Date Taking? Authorizing Provider  cyanocobalamin (,VITAMIN B-12,) 1000 MCG/ML injection INJECT 1 ML INTO THE MUSCLE ONCE FOR 1 DOSE, ONCE A MONTH FOR 6 MONTHS 04/08/20    Philip Aspen, Limmie Patricia, MD  cyclobenzaprine (FLEXERIL) 5 MG tablet Take 1 tablet (5 mg total) by mouth 3 (three) times daily as needed for muscle spasms. 06/24/20   Particia Nearing, PA-C  diclofenac (VOLTAREN) 75 MG EC tablet Take 1 tablet (75 mg total) by mouth 2 (two) times daily. 06/12/20   Nelwyn Salisbury, MD  hydrochlorothiazide (HYDRODIURIL) 25 MG tablet Take 1 tablet (25 mg total) by mouth daily. 02/21/20   Philip Aspen, Limmie Patricia, MD  ibuprofen (ADVIL,MOTRIN) 600 MG tablet Take 1 tablet (600 mg total) by mouth every 6 (six) hours as needed. 06/12/18   Lorre Nick, MD  lisinopril (ZESTRIL) 40 MG tablet Take 1 tablet (40 mg total) by mouth daily. 02/21/20   Philip Aspen, Limmie Patricia, MD  metFORMIN (GLUCOPHAGE) 500 MG tablet TAKE ONE TABLET BY MOUTH ONCE DAILY WITH BREAKFAST 02/21/20   Philip Aspen, Limmie Patricia, MD  NEEDLE, DISP, 25 G (B-D DISP NEEDLE 25GX1") 25G X 1" MISC Inject 1,000 mcg of B12 once a month for 6 months. 09/22/19   Philip Aspen, Limmie Patricia, MD  potassium chloride SA (KLOR-CON) 20 MEQ tablet Take 2 tablets (40 mEq total) by mouth daily for 5 days. 08/17/19 08/22/19  Philip Aspen, Limmie Patricia, MD  predniSONE (DELTASONE) 20 MG tablet Take 2 tablets (40 mg total) by mouth daily with breakfast. 06/24/20   Particia Nearing, PA-C  simvastatin (ZOCOR) 40 MG tablet  Take 1 tablet (40 mg total) by mouth daily with breakfast. 02/21/20   Philip Aspen, Limmie Patricia, MD    Family History Family History  Problem Relation Age of Onset  . Heart disease Other   . Hypertension Other   . Obesity Other     Social History Social History   Tobacco Use  . Smoking status: Never Smoker  . Smokeless tobacco: Never Used  Substance Use Topics  . Alcohol use: Yes    Alcohol/week: 12.0 standard drinks    Types: 12 Standard drinks or equivalent per week  . Drug use: No     Allergies   Patient has no known allergies.   Review of Systems Review of Systems PER HPI  Physical  Exam Triage Vital Signs ED Triage Vitals [06/24/20 1350]  Enc Vitals Group     BP (!) 187/93     Pulse Rate 79     Resp 18     Temp 98.4 F (36.9 C)     Temp src      SpO2 100 %     Weight      Height      Head Circumference      Peak Flow      Pain Score 8     Pain Loc      Pain Edu?      Excl. in GC?    No data found.  Updated Vital Signs BP (!) 187/93 (BP Location: Right Arm)   Pulse 79   Temp 98.4 F (36.9 C)   Resp 18   SpO2 100%   Visual Acuity Right Eye Distance:   Left Eye Distance:   Bilateral Distance:    Right Eye Near:   Left Eye Near:    Bilateral Near:     Physical Exam Vitals and nursing note reviewed.  Constitutional:      Appearance: Normal appearance.  HENT:     Head: Atraumatic.  Eyes:     Extraocular Movements: Extraocular movements intact.     Conjunctiva/sclera: Conjunctivae normal.  Cardiovascular:     Rate and Rhythm: Normal rate and regular rhythm.  Pulmonary:     Effort: Pulmonary effort is normal.     Breath sounds: Normal breath sounds.  Abdominal:     General: Bowel sounds are normal. There is no distension.     Palpations: Abdomen is soft.     Tenderness: There is no abdominal tenderness. There is no guarding.  Musculoskeletal:        General: Tenderness (localized ttp AC joint) present. No swelling, deformity or signs of injury.     Cervical back: Normal range of motion and neck supple.     Comments: Overall, good ROM in 3/4 extremities. Left UE exam very limited by patient pain with movement. Grip strength intact b/l UEs. Minimal lateral extension  Skin:    General: Skin is warm and dry.     Findings: No erythema or rash.  Neurological:     General: No focal deficit present.     Mental Status: He is oriented to person, place, and time.     Sensory: No sensory deficit.     Motor: No weakness.  Psychiatric:        Mood and Affect: Mood normal.        Thought Content: Thought content normal.        Judgment: Judgment  normal.    Labs (all labs ordered are listed, but only abnormal results are  displayed) Labs Reviewed - No data to display  EKG   Radiology DG Shoulder Left  Result Date: 06/24/2020 CLINICAL DATA:  LEFT shoulder pain.  No known injury. EXAM: LEFT SHOULDER - 2+ VIEW COMPARISON:  None. FINDINGS: Osseous alignment is normal. Bone mineralization is normal. No fracture line or displaced fracture fragment. No acute or suspicious osseous lesion. No significant degenerative change seen. Soft tissues about the LEFT shoulder are unremarkable. IMPRESSION: Negative. Electronically Signed   By: Bary Richard M.D.   On: 06/24/2020 15:10    Procedures Procedures (including critical care time)  Medications Ordered in UC Medications - No data to display  Initial Impression / Assessment and Plan / UC Course  I have reviewed the triage vital signs and the nursing notes.  Pertinent labs & imaging results that were available during my care of the patient were reviewed by me and considered in my medical decision making (see chart for details).     X-ray today negative for degenerative changes, dislocation, or other bony injury. Consistent with muscular cause, will tx with heat, massage, diclofenac gel, and prednisone and flexeril sent to pharmacy. Discussed stretches and exercises once feeling a bit better. F/u with PCP if not resolving.   Final Clinical Impressions(s) / UC Diagnoses   Final diagnoses:  Acute pain of left shoulder     Discharge Instructions     Your shoulder x-ray came back normal, so I suspect your pain is more muscular in nature. You can apply heating pads and topical gels like icy hot or biofreeze, and take the medications as prescribed. Make sure to be stretching and moving the shoulder around as you are able. Follow up with your primary care provider if not getting better    ED Prescriptions    Medication Sig Dispense Auth. Provider   cyclobenzaprine (FLEXERIL) 5 MG tablet  Take 1 tablet (5 mg total) by mouth 3 (three) times daily as needed for muscle spasms. 30 tablet Particia Nearing, [place]   predniSONE (DELTASONE) 20 MG tablet Take 2 tablets (40 mg total) by mouth daily with breakfast. 10 tablet Particia Nearing, [place]     PDMP not reviewed this encounter.   Particia Nearing, [place] 06/24/20 1923

## 2020-07-21 ENCOUNTER — Other Ambulatory Visit: Payer: Self-pay | Admitting: Internal Medicine

## 2020-08-17 ENCOUNTER — Other Ambulatory Visit: Payer: Self-pay | Admitting: Internal Medicine

## 2020-08-31 ENCOUNTER — Other Ambulatory Visit: Payer: Self-pay | Admitting: Internal Medicine

## 2020-08-31 DIAGNOSIS — I10 Essential (primary) hypertension: Secondary | ICD-10-CM

## 2020-09-23 ENCOUNTER — Other Ambulatory Visit: Payer: Self-pay | Admitting: Internal Medicine

## 2020-09-23 DIAGNOSIS — E118 Type 2 diabetes mellitus with unspecified complications: Secondary | ICD-10-CM

## 2020-09-23 DIAGNOSIS — E785 Hyperlipidemia, unspecified: Secondary | ICD-10-CM

## 2020-09-23 DIAGNOSIS — E1169 Type 2 diabetes mellitus with other specified complication: Secondary | ICD-10-CM

## 2020-10-03 ENCOUNTER — Other Ambulatory Visit: Payer: Self-pay | Admitting: Internal Medicine

## 2020-10-03 DIAGNOSIS — E559 Vitamin D deficiency, unspecified: Secondary | ICD-10-CM

## 2020-10-04 ENCOUNTER — Other Ambulatory Visit: Payer: Self-pay | Admitting: Internal Medicine

## 2020-10-25 ENCOUNTER — Other Ambulatory Visit: Payer: Self-pay | Admitting: Internal Medicine

## 2020-10-25 DIAGNOSIS — E118 Type 2 diabetes mellitus with unspecified complications: Secondary | ICD-10-CM

## 2020-10-25 DIAGNOSIS — E1169 Type 2 diabetes mellitus with other specified complication: Secondary | ICD-10-CM

## 2020-10-31 ENCOUNTER — Other Ambulatory Visit: Payer: Self-pay | Admitting: Internal Medicine

## 2020-10-31 DIAGNOSIS — E1169 Type 2 diabetes mellitus with other specified complication: Secondary | ICD-10-CM

## 2020-10-31 DIAGNOSIS — E785 Hyperlipidemia, unspecified: Secondary | ICD-10-CM

## 2020-11-01 ENCOUNTER — Other Ambulatory Visit: Payer: Self-pay | Admitting: Internal Medicine

## 2020-11-01 DIAGNOSIS — E785 Hyperlipidemia, unspecified: Secondary | ICD-10-CM

## 2020-11-01 DIAGNOSIS — E1169 Type 2 diabetes mellitus with other specified complication: Secondary | ICD-10-CM

## 2020-11-07 ENCOUNTER — Telehealth: Payer: Self-pay | Admitting: Internal Medicine

## 2020-11-07 DIAGNOSIS — E1169 Type 2 diabetes mellitus with other specified complication: Secondary | ICD-10-CM

## 2020-11-07 DIAGNOSIS — E785 Hyperlipidemia, unspecified: Secondary | ICD-10-CM

## 2020-11-07 DIAGNOSIS — E118 Type 2 diabetes mellitus with unspecified complications: Secondary | ICD-10-CM

## 2020-11-07 DIAGNOSIS — I10 Essential (primary) hypertension: Secondary | ICD-10-CM

## 2020-11-07 NOTE — Telephone Encounter (Signed)
Pt is calling in stating that he has not had his Rx simvastatin (ZOCOR) 40 MG for over 3 days and would like to see if it can be called in today.   Pharm:  [facility]  Pt stated that he went to the pharmacy and they told him that they had faxed a request to [place].  Pt is aware that we are asking for 24-72 hrs for refills.

## 2020-11-08 MED ORDER — SIMVASTATIN 40 MG PO TABS: ORAL_TABLET | ORAL | 0 refills | Status: DC

## 2020-11-08 MED ORDER — LISINOPRIL 40 MG PO TABS: 40.0000 mg | ORAL_TABLET | Freq: Every day | ORAL | 0 refills | Status: DC

## 2020-11-08 MED ORDER — METFORMIN HCL 500 MG PO TABS: ORAL_TABLET | ORAL | 0 refills | Status: DC

## 2020-11-08 MED ORDER — HYDROCHLOROTHIAZIDE 25 MG PO TABS: 25.0000 mg | ORAL_TABLET | Freq: Every day | ORAL | 0 refills | Status: DC

## 2020-11-08 NOTE — Telephone Encounter (Signed)
Refills sent.  Patient is aware that he will need a physical for further refills.  Can you please schedule this patient for a physical?

## 2020-11-08 NOTE — Telephone Encounter (Signed)
LVM for pt to call to schedule a physical.

## 2020-11-11 NOTE — Telephone Encounter (Signed)
Scheduled physical with patient

## 2020-11-20 ENCOUNTER — Other Ambulatory Visit: Payer: Self-pay

## 2020-11-20 ENCOUNTER — Encounter: Payer: Self-pay | Admitting: Internal Medicine

## 2020-11-20 ENCOUNTER — Ambulatory Visit (INDEPENDENT_AMBULATORY_CARE_PROVIDER_SITE_OTHER): Payer: Commercial Managed Care - PPO | Admitting: Internal Medicine

## 2020-11-20 VITALS — BP 140/90 | HR 75 | Temp 98.1°F | Ht 68.0 in | Wt 267.0 lb

## 2020-11-20 DIAGNOSIS — Z1211 Encounter for screening for malignant neoplasm of colon: Secondary | ICD-10-CM | POA: Diagnosis not present

## 2020-11-20 DIAGNOSIS — I10 Essential (primary) hypertension: Secondary | ICD-10-CM

## 2020-11-20 DIAGNOSIS — Z23 Encounter for immunization: Secondary | ICD-10-CM

## 2020-11-20 DIAGNOSIS — E118 Type 2 diabetes mellitus with unspecified complications: Secondary | ICD-10-CM | POA: Diagnosis not present

## 2020-11-20 DIAGNOSIS — E785 Hyperlipidemia, unspecified: Secondary | ICD-10-CM | POA: Diagnosis not present

## 2020-11-20 DIAGNOSIS — Z0000 Encounter for general adult medical examination without abnormal findings: Secondary | ICD-10-CM | POA: Diagnosis not present

## 2020-11-20 DIAGNOSIS — E1169 Type 2 diabetes mellitus with other specified complication: Secondary | ICD-10-CM

## 2020-11-20 DIAGNOSIS — E559 Vitamin D deficiency, unspecified: Secondary | ICD-10-CM | POA: Diagnosis not present

## 2020-11-20 DIAGNOSIS — M21612 Bunion of left foot: Secondary | ICD-10-CM

## 2020-11-20 DIAGNOSIS — E538 Deficiency of other specified B group vitamins: Secondary | ICD-10-CM

## 2020-11-20 LAB — LIPID PANEL
Cholesterol: 152 mg/dL (ref 0–200)
HDL: 52.6 mg/dL (ref >39.00)
LDL Cholesterol: 88 mg/dL (ref 0–99)
NonHDL: 99.77
Total CHOL/HDL Ratio: 3
Triglycerides: 60 mg/dL (ref 0.0–149.0)
VLDL: 12 mg/dL (ref 0.0–40.0)

## 2020-11-20 LAB — COMPREHENSIVE METABOLIC PANEL
ALT: 15 U/L (ref 0–53)
AST: 19 U/L (ref 0–37)
Albumin: 3.9 g/dL (ref 3.5–5.2)
Alkaline Phosphatase: 67 U/L (ref 39–117)
BUN: 19 mg/dL (ref 6–23)
CO2: 33 mEq/L — ABNORMAL HIGH (ref 19–32)
Calcium: 9.3 mg/dL (ref 8.4–10.5)
Chloride: 100 mEq/L (ref 96–112)
Creatinine, Ser: 0.87 mg/dL (ref 0.40–1.50)
GFR: 93.11 mL/min (ref >60.00)
Glucose, Bld: 105 mg/dL — ABNORMAL HIGH (ref 70–99)
Potassium: 3.9 mEq/L (ref 3.5–5.1)
Sodium: 138 mEq/L (ref 135–145)
Total Bilirubin: 0.6 mg/dL (ref 0.2–1.2)
Total Protein: 6.9 g/dL (ref 6.0–8.3)

## 2020-11-20 LAB — CBC WITH DIFFERENTIAL/PLATELET
Basophils Absolute: 0 10*3/uL (ref 0.0–0.1)
Basophils Relative: 1 % (ref 0.0–3.0)
Eosinophils Absolute: 0.1 10*3/uL (ref 0.0–0.7)
Eosinophils Relative: 1.6 % (ref 0.0–5.0)
HCT: 41.7 % (ref 39.0–52.0)
Hemoglobin: 13.8 g/dL (ref 13.0–17.0)
Lymphocytes Relative: 40.2 % (ref 12.0–46.0)
Lymphs Abs: 1.4 10*3/uL (ref 0.7–4.0)
MCHC: 33.1 g/dL (ref 30.0–36.0)
MCV: 89.3 fl (ref 78.0–100.0)
Monocytes Absolute: 0.4 10*3/uL (ref 0.1–1.0)
Monocytes Relative: 12.8 % — ABNORMAL HIGH (ref 3.0–12.0)
Neutro Abs: 1.5 10*3/uL (ref 1.4–7.7)
Neutrophils Relative %: 44.4 % (ref 43.0–77.0)
Platelets: 171 10*3/uL (ref 150.0–400.0)
RBC: 4.67 Mil/uL (ref 4.22–5.81)
RDW: 14.5 % (ref 11.5–15.5)
WBC: 3.4 10*3/uL — ABNORMAL LOW (ref 4.0–10.5)

## 2020-11-20 LAB — TSH: TSH: 2.18 u[IU]/mL (ref 0.35–4.50)

## 2020-11-20 LAB — HEMOGLOBIN A1C: Hgb A1c MFr Bld: 6.2 % (ref 4.6–6.5)

## 2020-11-20 LAB — VITAMIN B12: Vitamin B-12: 273 pg/mL (ref 211–911)

## 2020-11-20 LAB — VITAMIN D 25 HYDROXY (VIT D DEFICIENCY, FRACTURES): VITD: 20.47 ng/mL — ABNORMAL LOW (ref 30.00–100.00)

## 2020-11-20 MED ORDER — AMLODIPINE BESYLATE 5 MG PO TABS: 5.0000 mg | ORAL_TABLET | Freq: Every day | ORAL | 1 refills | Status: DC

## 2020-11-20 NOTE — Patient Instructions (Signed)
-Nice seeing you today!!  -Lab work today; will notify you once results are available.  -flu and pneumonia vaccines today.  -Remember your eye and dental exams.  -Start amlodipine 5 mg daily.  -See you back in 2 months for BP check.   Preventive Care 62-62 Years Old, Male Preventive care refers to lifestyle choices and visits with your health care provider that can promote health and wellness. This includes:  A yearly physical exam. This is also called an annual wellness visit.  Regular dental and eye exams.  Immunizations.  Screening for certain conditions.  Healthy lifestyle choices, such as: ? Eating a healthy diet. ? Getting regular exercise. ? Not using drugs or products that contain nicotine and tobacco. ? Limiting alcohol use. What can I expect for my preventive care visit? Physical exam Your health care provider will check your:  Height and weight. These may be used to calculate your BMI (body mass index). BMI is a measurement that tells if you are at a healthy weight.  Heart rate and blood pressure.  Body temperature.  Skin for abnormal spots. Counseling Your health care provider may ask you questions about your:  Past medical problems.  Family's medical history.  Alcohol, tobacco, and drug use.  Emotional well-being.  Home life and relationship well-being.  Sexual activity.  Diet, exercise, and sleep habits.  Work and work Astronomer.  Access to firearms. What immunizations do I need? Vaccines are usually given at various ages, according to a schedule. Your health care provider will recommend vaccines for you based on your age, medical history, and lifestyle or other factors, such as travel or where you work.   What tests do I need? Blood tests  Lipid and cholesterol levels. These may be checked every 5 years, or more often if you are over 47 years old.  Hepatitis C test.  Hepatitis B test. Screening  Lung cancer screening. You may  have this screening every year starting at age 66 if you have a 30-pack-year history of smoking and currently smoke or have quit within the past 15 years.  Prostate cancer screening. Recommendations will vary depending on your family history and other risks.  Genital exam to check for testicular cancer or hernias.  Colorectal cancer screening. ? All adults should have this screening starting at age 10 and continuing until age 67. ? Your health care provider may recommend screening at age 83 if you are at increased risk. ? You will have tests every 1-10 years, depending on your results and the type of screening test.  Diabetes screening. ? This is done by checking your blood sugar (glucose) after you have not eaten for a while (fasting). ? You may have this done every 1-3 years.  STD (sexually transmitted disease) testing, if you are at risk. Follow these instructions at home: Eating and drinking  Eat a diet that includes fresh fruits and vegetables, whole grains, lean protein, and low-fat dairy products.  Take vitamin and mineral supplements as recommended by your health care provider.  Do not drink alcohol if your health care provider tells you not to drink.  If you drink alcohol: ? Limit how much you have to 0-2 drinks a day. ? Be aware of how much alcohol is in your drink. In the [place] one drink equals one 12 oz bottle of beer (355 mL), one 5 oz glass of wine (148 mL), or one 1 oz glass of hard liquor (44 mL).   Lifestyle  Take daily  care of your teeth and gums. Brush your teeth every morning and night with fluoride toothpaste. Floss one time each day.  Stay active. Exercise for at least 30 minutes 5 or more days each week.  Do not use any products that contain nicotine or tobacco, such as cigarettes, e-cigarettes, and chewing tobacco. If you need help quitting, ask your health care provider.  Do not use drugs.  If you are sexually active, practice safe sex. Use a condom or  other form of protection to prevent STIs (sexually transmitted infections).  If told by your health care provider, take low-dose aspirin daily starting at age 29.  Find healthy ways to cope with stress, such as: ? Meditation, yoga, or listening to music. ? Journaling. ? Talking to a trusted person. ? Spending time with friends and family. Safety  Always wear your seat belt while driving or riding in a vehicle.  Do not drive: ? If you have been drinking alcohol. Do not ride with someone who has been drinking. ? When you are tired or distracted. ? While texting.  Wear a helmet and other protective equipment during sports activities.  If you have firearms in your house, make sure you follow all gun safety procedures. What's next?  Go to your health care provider once a year for an annual wellness visit.  Ask your health care provider how often you should have your eyes and teeth checked.  Stay up to date on all vaccines. This information is not intended to replace advice given to you by your health care provider. Make sure you discuss any questions you have with your health care provider. Document Revised: 07/04/2019 Document Reviewed: 09/29/2018 Elsevier Patient Education  2021 [employer].

## 2020-11-20 NOTE — Progress Notes (Signed)
Established Patient Office Visit     This visit occurred during the SARS-CoV-2 public health emergency.  Safety protocols were in place, including screening questions prior to the visit, additional usage of staff PPE, and extensive cleaning of exam room while observing appropriate contact time as indicated for disinfecting solutions.    CC/Reason for Visit: Annual preventive exam and follow-up chronic medical conditions  HPI: Jim Green is a 60 y.o. male who is coming in today for the above mentioned reasons. Past Medical History is significant for: Hyperlipidemia, type 2 diabetes, hypertension, vitamin D and vitamin B12 deficiency.  He continues to have issues with hypertension despite medication adjustment.  He eats low-sodium.  He has no complaints today.  He does not have routine eye and dental care.  He does not exercise, immunizations that are pending include his COVID booster, influenza and Prevnar.  He is overdue for screening colonoscopy.   Past Medical/Surgical History: Past Medical History:  Diagnosis Date  . Allergy   . Diabetes mellitus without complication (HCC)   . Eczema   . ED (erectile dysfunction)   . Hyperlipidemia   . Hypertension   . Obesity   . Sleep apnea     Past Surgical History:  Procedure Laterality Date  . KNEE ARTHROSCOPY     right knee  . NASAL SINUS SURGERY      Social History:  reports that he has never smoked. He has never used smokeless tobacco. He reports current alcohol use of about 12.0 standard drinks of alcohol per week. He reports that he does not use drugs.  Allergies: No Known Allergies  Family History:  Family History  Problem Relation Age of Onset  . Heart disease Other   . Hypertension Other   . Obesity Other      Current Outpatient Medications:  .  amLODipine (NORVASC) 5 MG tablet, Take 1 tablet (5 mg total) by mouth daily., Disp: 90 tablet, Rfl: 1 .  cyanocobalamin (,VITAMIN B-12,) 1000 MCG/ML injection,  INJECT 1 ML (CC) INTO THE MUSCLE ONCE EVERY MONTH, Disp: 1 mL, Rfl: 0 .  cyclobenzaprine (FLEXERIL) 5 MG tablet, Take 1 tablet (5 mg total) by mouth 3 (three) times daily as needed for muscle spasms., Disp: 30 tablet, Rfl: 0 .  diclofenac (VOLTAREN) 75 MG EC tablet, Take 1 tablet (75 mg total) by mouth 2 (two) times daily., Disp: 60 tablet, Rfl: 0 .  hydrochlorothiazide (HYDRODIURIL) 25 MG tablet, Take 1 tablet (25 mg total) by mouth daily., Disp: 90 tablet, Rfl: 0 .  ibuprofen (ADVIL,MOTRIN) 600 MG tablet, Take 1 tablet (600 mg total) by mouth every 6 (six) hours as needed., Disp: 30 tablet, Rfl: 0 .  lisinopril (ZESTRIL) 40 MG tablet, Take 1 tablet (40 mg total) by mouth daily., Disp: 90 tablet, Rfl: 0 .  metFORMIN (GLUCOPHAGE) 500 MG tablet, TAKE 1 TABLET BY MOUTH ONCE DAILY WITH BREAKFAST . APPOINTMENT REQUIRED FOR FUTURE REFILLS, Disp: 90 tablet, Rfl: 0 .  NEEDLE, DISP, 25 G (B-D DISP NEEDLE 25GX1") 25G X 1" MISC, Inject 1,000 mcg of B12 once a month for 6 months., Disp: 1 each, Rfl: 5 .  simvastatin (ZOCOR) 40 MG tablet, TAKE 1 TABLET BY MOUTH IN THE MORNING WITH BREAKFAST, Disp: 90 tablet, Rfl: 0 .  potassium chloride SA (KLOR-CON) 20 MEQ tablet, Take 2 tablets (40 mEq total) by mouth daily for 5 days., Disp: 10 tablet, Rfl: 0  Review of Systems:  Constitutional: Denies fever, chills, diaphoresis, appetite change  and fatigue.  HEENT: Denies photophobia, eye pain, redness, hearing loss, ear pain, congestion, sore throat, rhinorrhea, sneezing, mouth sores, trouble swallowing, neck pain, neck stiffness and tinnitus.   Respiratory: Denies SOB, DOE, cough, chest tightness,  and wheezing.   Cardiovascular: Denies chest pain, palpitations and leg swelling.  Gastrointestinal: Denies nausea, vomiting, abdominal pain, diarrhea, constipation, blood in stool and abdominal distention.  Genitourinary: Denies dysuria, urgency, frequency, hematuria, flank pain and difficulty urinating.  Endocrine: Denies:  hot or cold intolerance, sweats, changes in hair or nails, polyuria, polydipsia. Musculoskeletal: Denies myalgias, back pain, joint swelling, arthralgias and gait problem.  Skin: Denies pallor, rash and wound.  Neurological: Denies dizziness, seizures, syncope, weakness, light-headedness, numbness and headaches.  Hematological: Denies adenopathy. Easy bruising, personal or family bleeding history  Psychiatric/Behavioral: Denies suicidal ideation, mood changes, confusion, nervousness, sleep disturbance and agitation    Physical Exam: Vitals:   11/20/20 0805  BP: 140/90  Pulse: 75  Temp: 98.1 F (36.7 C)  TempSrc: Oral  SpO2: 99%  Weight: 267 lb (121.1 kg)  Height: 5\' 8"  (1.727 m)    Body mass index is 40.6 kg/m.   Constitutional: NAD, calm, comfortable Eyes: PERRL, lids and conjunctivae normal ENMT: Mucous membranes are moist. Posterior pharynx clear of any exudate or lesions. Normal dentition. Tympanic membrane is pearly white, no erythema or bulging. Neck: normal, supple, no masses, no thyromegaly Respiratory: clear to auscultation bilaterally, no wheezing, no crackles. Normal respiratory effort. No accessory muscle use.  Cardiovascular: Regular rate and rhythm, no murmurs / rubs / gallops. No extremity edema. 2+ pedal pulses.  Abdomen: no tenderness, no masses palpated. No hepatosplenomegaly. Bowel sounds positive.  Musculoskeletal: no clubbing / cyanosis. No joint deformity upper and lower extremities. Good ROM, no contractures. Normal muscle tone.  Skin: no rashes, lesions, ulcers. No induration Neurologic: CN 2-12 grossly intact. Sensation intact, DTR normal. Strength 5/5 in all 4.  Psychiatric: Normal judgment and insight. Alert and oriented x 3. Normal mood.    Impression and Plan:  Encounter for preventive health examination   -I have advised routine eye and dental care. -He has received influenza and PCV 13 in office today.  He has had both shingles vaccine, he is  also due for Covid booster which I have advised to schedule as soon as possible. -Screening labs today -Healthy lifestyle discussed in detail. -GI referral for screening colonoscopy today.  Screening for malignant neoplasm of colon  - Plan: Ambulatory referral to Gastroenterology  DM type 2, controlled, with complication (HCC) ] -Check A1c today, last A1c was at goal at 5.7.  Bunion of left foot  - Plan: Ambulatory referral to Podiatry  Vitamin D deficiency  - Plan: VITAMIN D 25 Hydroxy (Vit-D Deficiency, Fractures)  Vitamin B12 deficiency  - Plan: Vitamin B12  Essential hypertension -Not at goal. -Continue hydrochlorothiazide 25 mg daily with lisinopril 40 mg daily. -Add amlodipine 5 mg daily. -Return in 6 to 8 weeks for follow-up.  Hyperlipidemia associated with type 2 diabetes mellitus (HCC)  - Plan: Lipid panel -Goal LDL less than 70, last LDL was 90 in October 2020, he is on simvastatin 40 mg daily.  Need for influenza vaccination -Flu vaccine administered today.  Need for vaccination against Streptococcus pneumoniae -PCV 13 administered today.   Patient Instructions   -Nice seeing you today!!  -Lab work today; will notify you once results are available.  -flu and pneumonia vaccines today.  -Remember your eye and dental exams.  -Start amlodipine 5 mg daily.  -  See you back in 2 months for BP check.   Preventive Care 59-41 Years Old, Male Preventive care refers to lifestyle choices and visits with your health care provider that can promote health and wellness. This includes:  A yearly physical exam. This is also called an annual wellness visit.  Regular dental and eye exams.  Immunizations.  Screening for certain conditions.  Healthy lifestyle choices, such as: ? Eating a healthy diet. ? Getting regular exercise. ? Not using drugs or products that contain nicotine and tobacco. ? Limiting alcohol use. What can I expect for my preventive care  visit? Physical exam Your health care provider will check your:  Height and weight. These may be used to calculate your BMI (body mass index). BMI is a measurement that tells if you are at a healthy weight.  Heart rate and blood pressure.  Body temperature.  Skin for abnormal spots. Counseling Your health care provider may ask you questions about your:  Past medical problems.  Family's medical history.  Alcohol, tobacco, and drug use.  Emotional well-being.  Home life and relationship well-being.  Sexual activity.  Diet, exercise, and sleep habits.  Work and work Astronomer.  Access to firearms. What immunizations do I need? Vaccines are usually given at various ages, according to a schedule. Your health care provider will recommend vaccines for you based on your age, medical history, and lifestyle or other factors, such as travel or where you work.   What tests do I need? Blood tests  Lipid and cholesterol levels. These may be checked every 5 years, or more often if you are over 50 years old.  Hepatitis C test.  Hepatitis B test. Screening  Lung cancer screening. You may have this screening every year starting at age 72 if you have a 30-pack-year history of smoking and currently smoke or have quit within the past 15 years.  Prostate cancer screening. Recommendations will vary depending on your family history and other risks.  Genital exam to check for testicular cancer or hernias.  Colorectal cancer screening. ? All adults should have this screening starting at age 58 and continuing until age 78. ? Your health care provider may recommend screening at age 47 if you are at increased risk. ? You will have tests every 1-10 years, depending on your results and the type of screening test.  Diabetes screening. ? This is done by checking your blood sugar (glucose) after you have not eaten for a while (fasting). ? You may have this done every 1-3 years.  STD  (sexually transmitted disease) testing, if you are at risk. Follow these instructions at home: Eating and drinking  Eat a diet that includes fresh fruits and vegetables, whole grains, lean protein, and low-fat dairy products.  Take vitamin and mineral supplements as recommended by your health care provider.  Do not drink alcohol if your health care provider tells you not to drink.  If you drink alcohol: ? Limit how much you have to 0-2 drinks a day. ? Be aware of how much alcohol is in your drink. In the [place] one drink equals one 12 oz bottle of beer (355 mL), one 5 oz glass of wine (148 mL), or one 1 oz glass of hard liquor (44 mL).   Lifestyle  Take daily care of your teeth and gums. Brush your teeth every morning and night with fluoride toothpaste. Floss one time each day.  Stay active. Exercise for at least 30 minutes 5 or more days  each week.  Do not use any products that contain nicotine or tobacco, such as cigarettes, e-cigarettes, and chewing tobacco. If you need help quitting, ask your health care provider.  Do not use drugs.  If you are sexually active, practice safe sex. Use a condom or other form of protection to prevent STIs (sexually transmitted infections).  If told by your health care provider, take low-dose aspirin daily starting at age 49.  Find healthy ways to cope with stress, such as: ? Meditation, yoga, or listening to music. ? Journaling. ? Talking to a trusted person. ? Spending time with friends and family. Safety  Always wear your seat belt while driving or riding in a vehicle.  Do not drive: ? If you have been drinking alcohol. Do not ride with someone who has been drinking. ? When you are tired or distracted. ? While texting.  Wear a helmet and other protective equipment during sports activities.  If you have firearms in your house, make sure you follow all gun safety procedures. What's next?  Go to your health care provider once a year for  an annual wellness visit.  Ask your health care provider how often you should have your eyes and teeth checked.  Stay up to date on all vaccines. This information is not intended to replace advice given to you by your health care provider. Make sure you discuss any questions you have with your health care provider. Document Revised: 07/04/2019 Document Reviewed: 09/29/2018 Elsevier Patient Education  2021 [employer] Inc[employer]      Chaya Jan, MD [facility]

## 2020-11-21 ENCOUNTER — Other Ambulatory Visit: Payer: Self-pay | Admitting: Internal Medicine

## 2020-11-21 DIAGNOSIS — E559 Vitamin D deficiency, unspecified: Secondary | ICD-10-CM

## 2020-11-21 MED ORDER — VITAMIN D (ERGOCALCIFEROL) 1.25 MG (50000 UNIT) PO CAPS: 50000.0000 [IU] | ORAL_CAPSULE | ORAL | 0 refills | Status: DC

## 2020-11-25 ENCOUNTER — Ambulatory Visit (INDEPENDENT_AMBULATORY_CARE_PROVIDER_SITE_OTHER): Payer: Commercial Managed Care - PPO | Admitting: Podiatry

## 2020-11-25 ENCOUNTER — Encounter: Payer: Self-pay | Admitting: Podiatry

## 2020-11-25 ENCOUNTER — Other Ambulatory Visit: Payer: Self-pay

## 2020-11-25 ENCOUNTER — Ambulatory Visit (INDEPENDENT_AMBULATORY_CARE_PROVIDER_SITE_OTHER): Payer: Commercial Managed Care - PPO

## 2020-11-25 DIAGNOSIS — M79672 Pain in left foot: Secondary | ICD-10-CM | POA: Diagnosis not present

## 2020-11-25 DIAGNOSIS — M1A0720 Idiopathic chronic gout, left ankle and foot, without tophus (tophi): Secondary | ICD-10-CM

## 2020-11-25 DIAGNOSIS — M79671 Pain in right foot: Secondary | ICD-10-CM

## 2020-11-25 DIAGNOSIS — M21619 Bunion of unspecified foot: Secondary | ICD-10-CM

## 2020-11-26 LAB — ARTHRITIS PANEL
Anti Nuclear Antibody (ANA): NEGATIVE
Rheumatoid fact SerPl-aCnc: <10 IU/mL (ref <14.0)
Sed Rate: 4 mm/hr (ref 0–30)
Uric Acid: 7.9 mg/dL (ref 3.8–8.4)

## 2020-11-26 NOTE — Progress Notes (Signed)
Subjective:   Patient ID: Jim Green, male   DOB: 60 y.o.   MRN: [id number]   HPI Patient presents stating that he has a large bunion deformity left but it seems like it is gotten worse several times over the last few months with fluid buildup and pain.  States that this is happened twice and has been very painful for him but he is always had problems with the bunion.  Patient does not smoke likes to be active   Review of Systems  All other systems reviewed and are negative.       Objective:  Physical Exam Vitals and nursing note reviewed.  Constitutional:      Appearance: He is well-developed and well-nourished.  Cardiovascular:     Pulses: Intact distal pulses.  Pulmonary:     Effort: Pulmonary effort is normal.  Musculoskeletal:        General: Normal range of motion.  Skin:    General: Skin is warm.  Neurological:     Mental Status: He is alert.     Neurovascular status intact muscle strength was found to be adequate range of motion adequate.  Patient is found to have a large prominence first metatarsal head left over right and then has extreme fluid around the first MPJ that is painful when pressed and make shoe gear difficult.  Patient has good digital perfusion well oriented x3 and does not currently have the capsulitis but has had 2 attacks of this over the last few months     Assessment:  Probability that there may be gout or other inflammatory systemic disease creating this with structural deformity also a part of the pathology     Plan:  H&P condition reviewed and at this point I went ahead and I discussed gout and its possibilities for this patient.  I am in order blood work I do not recommend treatment currently as its not hurting but I did discuss bunion correction in future or possible injection if it becomes inflammatory  X-rays indicate that there is large bunion deformity left over right no indications osteolysis or fluid buildup or indications of  arthritis

## 2020-12-03 ENCOUNTER — Other Ambulatory Visit: Payer: Self-pay

## 2020-12-03 ENCOUNTER — Telehealth (INDEPENDENT_AMBULATORY_CARE_PROVIDER_SITE_OTHER): Payer: Commercial Managed Care - PPO | Admitting: Internal Medicine

## 2020-12-03 DIAGNOSIS — Z20822 Contact with and (suspected) exposure to covid-19: Secondary | ICD-10-CM | POA: Diagnosis not present

## 2020-12-03 NOTE — Progress Notes (Signed)
Virtual Visit via Video Note  I connected with Jim Green on 12/03/20 at  3:30 PM EST by a video enabled telemedicine application and verified that I am speaking with the correct person using two identifiers.  Location patient: home Location provider: work office Persons participating in the virtual visit: patient, provider  I discussed the limitations of evaluation and management by telemedicine and the availability of in person appointments. The patient expressed understanding and agreed to proceed.   HPI: For the past 4 days he has been having sore throat, crackling noises in his ear and significant nasal congestion.  He has not yet had a Covid test.  He has not tried any over-the-counter preparations.  He wonders about need for an antibiotic.   ROS: Constitutional: Denies fever, chills, diaphoresis, appetite change and fatigue.  HEENT: Denies photophobia, eye pain, redness, hearing loss, mouth sores, trouble swallowing, neck pain, neck stiffness and tinnitus.   Respiratory: Denies SOB, DOE, chest tightness,  and wheezing.   Cardiovascular: Denies chest pain, palpitations and leg swelling.  Gastrointestinal: Denies nausea, vomiting, abdominal pain, diarrhea, constipation, blood in stool and abdominal distention.  Genitourinary: Denies dysuria, urgency, frequency, hematuria, flank pain and difficulty urinating.  Endocrine: Denies: hot or cold intolerance, sweats, changes in hair or nails, polyuria, polydipsia. Musculoskeletal: Denies myalgias, back pain, joint swelling, arthralgias and gait problem.  Skin: Denies pallor, rash and wound.  Neurological: Denies dizziness, seizures, syncope, weakness, light-headedness, numbness and headaches.  Hematological: Denies adenopathy. Easy bruising, personal or family bleeding history  Psychiatric/Behavioral: Denies suicidal ideation, mood changes, confusion, nervousness, sleep disturbance and agitation   Past Medical History:   Diagnosis Date  . Allergy   . Diabetes mellitus without complication (HCC)   . Eczema   . ED (erectile dysfunction)   . Hyperlipidemia   . Hypertension   . Obesity   . Sleep apnea     Past Surgical History:  Procedure Laterality Date  . KNEE ARTHROSCOPY     right knee  . NASAL SINUS SURGERY      Family History  Problem Relation Age of Onset  . Heart disease Other   . Hypertension Other   . Obesity Other     SOCIAL HX:   reports that he has never smoked. He has never used smokeless tobacco. He reports current alcohol use of about 12.0 standard drinks of alcohol per week. He reports that he does not use drugs.   Current Outpatient Medications:  .  amLODipine (NORVASC) 5 MG tablet, Take 1 tablet (5 mg total) by mouth daily., Disp: 90 tablet, Rfl: 1 .  cyanocobalamin (,VITAMIN B-12,) 1000 MCG/ML injection, INJECT 1 ML (CC) INTO THE MUSCLE ONCE EVERY MONTH, Disp: 1 mL, Rfl: 0 .  cyclobenzaprine (FLEXERIL) 5 MG tablet, Take 1 tablet (5 mg total) by mouth 3 (three) times daily as needed for muscle spasms., Disp: 30 tablet, Rfl: 0 .  diclofenac (VOLTAREN) 75 MG EC tablet, Take 1 tablet (75 mg total) by mouth 2 (two) times daily., Disp: 60 tablet, Rfl: 0 .  hydrochlorothiazide (HYDRODIURIL) 25 MG tablet, Take 1 tablet (25 mg total) by mouth daily., Disp: 90 tablet, Rfl: 0 .  ibuprofen (ADVIL,MOTRIN) 600 MG tablet, Take 1 tablet (600 mg total) by mouth every 6 (six) hours as needed., Disp: 30 tablet, Rfl: 0 .  lisinopril (ZESTRIL) 40 MG tablet, Take 1 tablet (40 mg total) by mouth daily., Disp: 90 tablet, Rfl: 0 .  metFORMIN (GLUCOPHAGE) 500 MG tablet, TAKE 1  TABLET BY MOUTH ONCE DAILY WITH BREAKFAST . APPOINTMENT REQUIRED FOR FUTURE REFILLS, Disp: 90 tablet, Rfl: 0 .  NEEDLE, DISP, 25 G (B-D DISP NEEDLE 25GX1") 25G X 1" MISC, Inject 1,000 mcg of B12 once a month for 6 months., Disp: 1 each, Rfl: 5 .  simvastatin (ZOCOR) 40 MG tablet, TAKE 1 TABLET BY MOUTH IN THE MORNING WITH  BREAKFAST, Disp: 90 tablet, Rfl: 0 .  Vitamin D, Ergocalciferol, (DRISDOL) 1.25 MG (50000 UNIT) CAPS capsule, Take 1 capsule (50,000 Units total) by mouth every 7 (seven) days for 12 doses., Disp: 12 capsule, Rfl: 0 .  potassium chloride SA (KLOR-CON) 20 MEQ tablet, Take 2 tablets (40 mEq total) by mouth daily for 5 days., Disp: 10 tablet, Rfl: 0  EXAM:   VITALS per patient if applicable: None reported  GENERAL: alert, oriented, appears well and in no acute distress  HEENT: atraumatic, conjunttiva clear, no obvious abnormalities on inspection of external nose and ears  NECK: normal movements of the head and neck  LUNGS: on inspection no signs of respiratory distress, breathing rate appears normal, no obvious gross increased work of breathing, gasping or wheezing  CV: no obvious cyanosis  MS: moves all visible extremities without noticeable abnormality  PSYCH/NEURO: pleasant and cooperative, no obvious depression or anxiety, speech and thought processing grossly intact  ASSESSMENT AND PLAN:   Encounter by telehealth for suspected COVID-19  -Based on his description, COVID seems a high possibility. -Have advised that he get a Covid PCR test today, he agrees.  In the meantime he may use OTC cold/flu preparations that contain a pain reliever, antihistamine and decongestant. -He will notify me with test results.   I discussed the assessment and treatment plan with the patient. The patient was provided an opportunity to ask questions and all were answered. The patient agreed with the plan and demonstrated an understanding of the instructions.   The patient was advised to call back or seek an in-person evaluation if the symptoms worsen or if the condition fails to improve as anticipated.    Chaya Jan, MD  [facility] at [facility]

## 2020-12-04 ENCOUNTER — Telehealth: Payer: Self-pay | Admitting: Internal Medicine

## 2020-12-04 NOTE — Telephone Encounter (Signed)
Patient is requesting a call from Jim Green.  He is also wanting to let Dr. Ardyth Harps know that his Covid test came back negative.  Please advise.

## 2020-12-04 NOTE — Telephone Encounter (Signed)
Patient is requesting a prescription to help with his cold.  He has tried OTC.

## 2020-12-04 NOTE — Telephone Encounter (Signed)
What symptoms is he having? Since he has only had symptoms for 4 days all Rx would be OTC for symptom management: mucinex D BID, ibuprofen/tylenol as needed, antihistamine daily (claritin/zyrtec/allegra). Glad COVID test was negative. Did he have a rapid antigen or a PCR? With the symptoms he had, if rapid antigen, I would repeat in 2-3 days for confirmation.

## 2020-12-05 NOTE — Telephone Encounter (Signed)
Yes

## 2020-12-05 NOTE — Telephone Encounter (Signed)
Patient is aware.  Patient requesting a letter for work 12/03/20-12/07/20.  Okay to write note?

## 2020-12-05 NOTE — Telephone Encounter (Signed)
Letter ready for pick up and patient is aware. 

## 2020-12-09 ENCOUNTER — Other Ambulatory Visit: Payer: Self-pay

## 2020-12-09 ENCOUNTER — Encounter: Payer: Self-pay | Admitting: Podiatry

## 2020-12-09 ENCOUNTER — Ambulatory Visit (INDEPENDENT_AMBULATORY_CARE_PROVIDER_SITE_OTHER): Payer: Commercial Managed Care - PPO | Admitting: Podiatry

## 2020-12-09 DIAGNOSIS — M1A0720 Idiopathic chronic gout, left ankle and foot, without tophus (tophi): Secondary | ICD-10-CM

## 2020-12-09 DIAGNOSIS — M21619 Bunion of unspecified foot: Secondary | ICD-10-CM

## 2020-12-11 NOTE — Progress Notes (Signed)
Subjective:   Patient ID: Jim Green, male   DOB: 60 y.o.   MRN: [id number]   HPI Patient presents stating that the pain is off and on and is not as bad as it was and he is concerned about gout and wants to reduce and review also the bunion   ROS      Objective:  Physical Exam  Neurovascular status intact no other change in health history with patient's left first MPJ being quite prominent and somewhat red but no drainage and no current swelling surrounding the joint     Assessment:  Significant structural deformity is noted left with also inflammatory changes consistent with possibility of gout with upon questioning what appears to be historic issues with gout      Plan:  H&P reviewed condition recommended wider shoes anti-inflammatories as needed and if we do see an elevation of inflammation fluid I want to see him back.  Reviewed his blood work indicating probability of [name] gout based on the higher number of uric acid

## 2020-12-21 ENCOUNTER — Other Ambulatory Visit: Payer: Self-pay | Admitting: Internal Medicine

## 2021-01-20 IMAGING — DX DG KNEE COMPLETE 4+V*R*
4 series · 4 of 4 positions shown · non-contrast
Comparison: Contralateral left knee radiograph 12/04/2014

CLINICAL DATA: Knee pain

EXAM:
RIGHT KNEE - COMPLETE 4+ VIEW

[knee ap (1 of 3)]
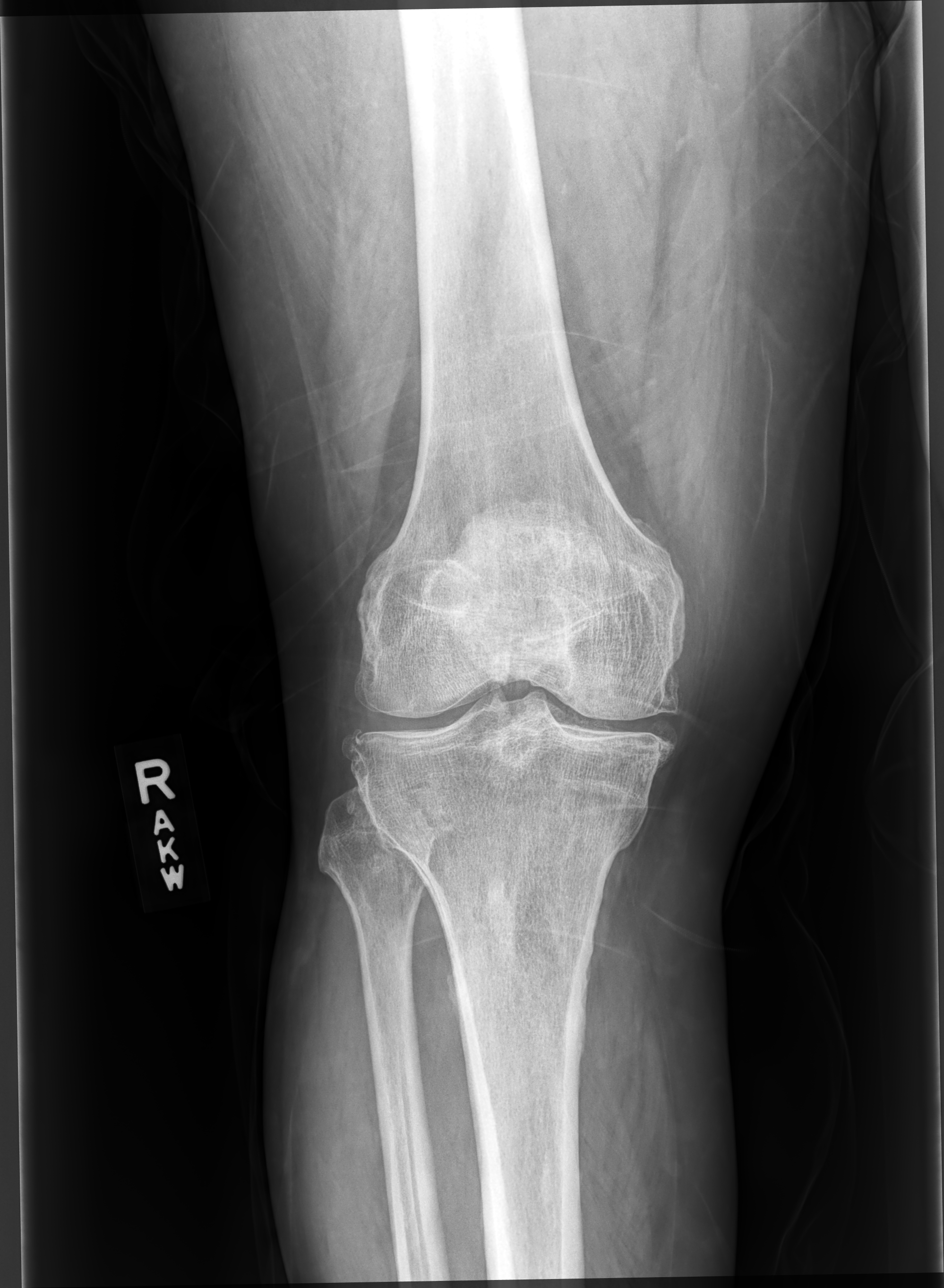

[knee ap (2 of 3)]
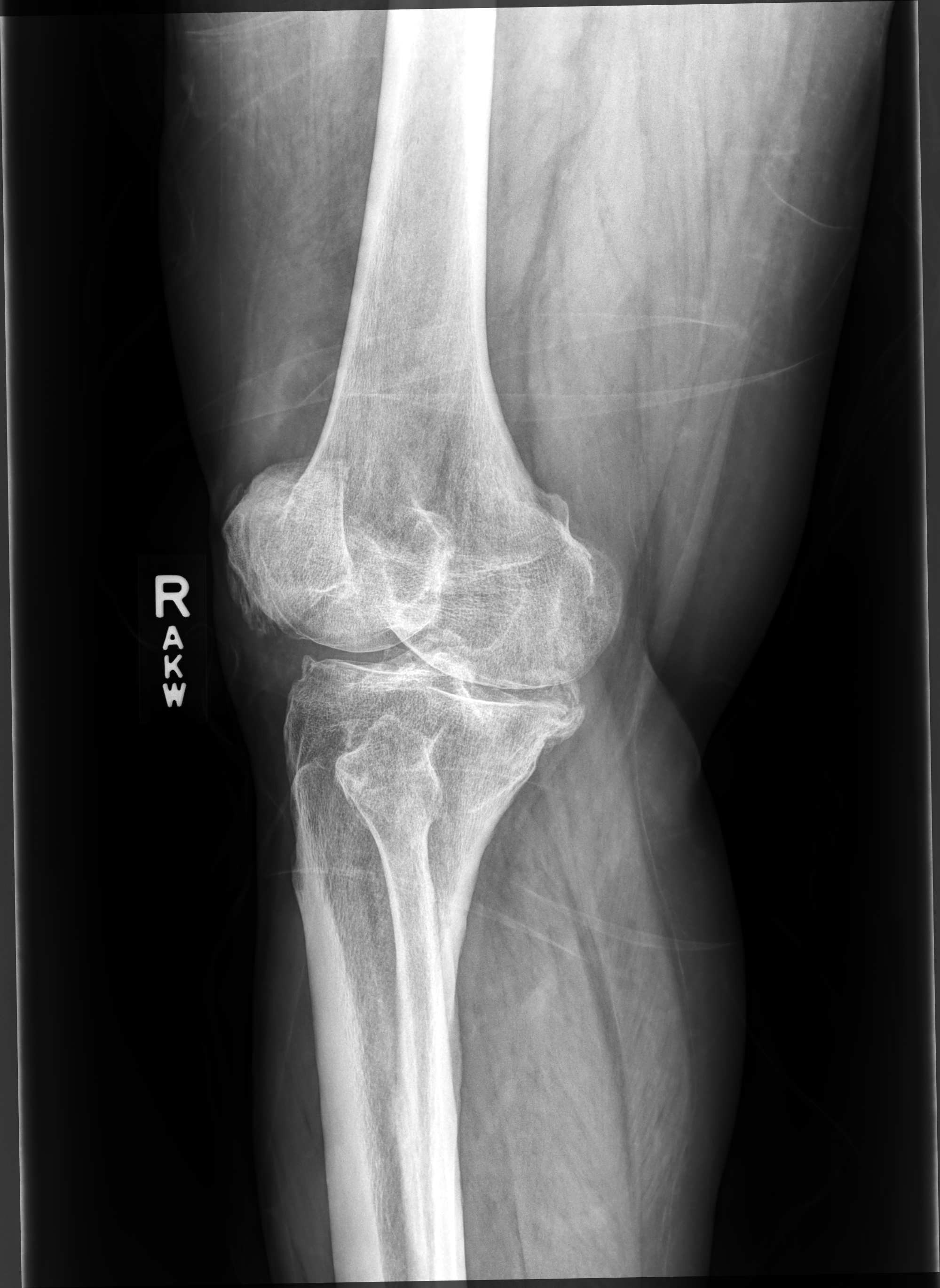

[knee ap (3 of 3)]
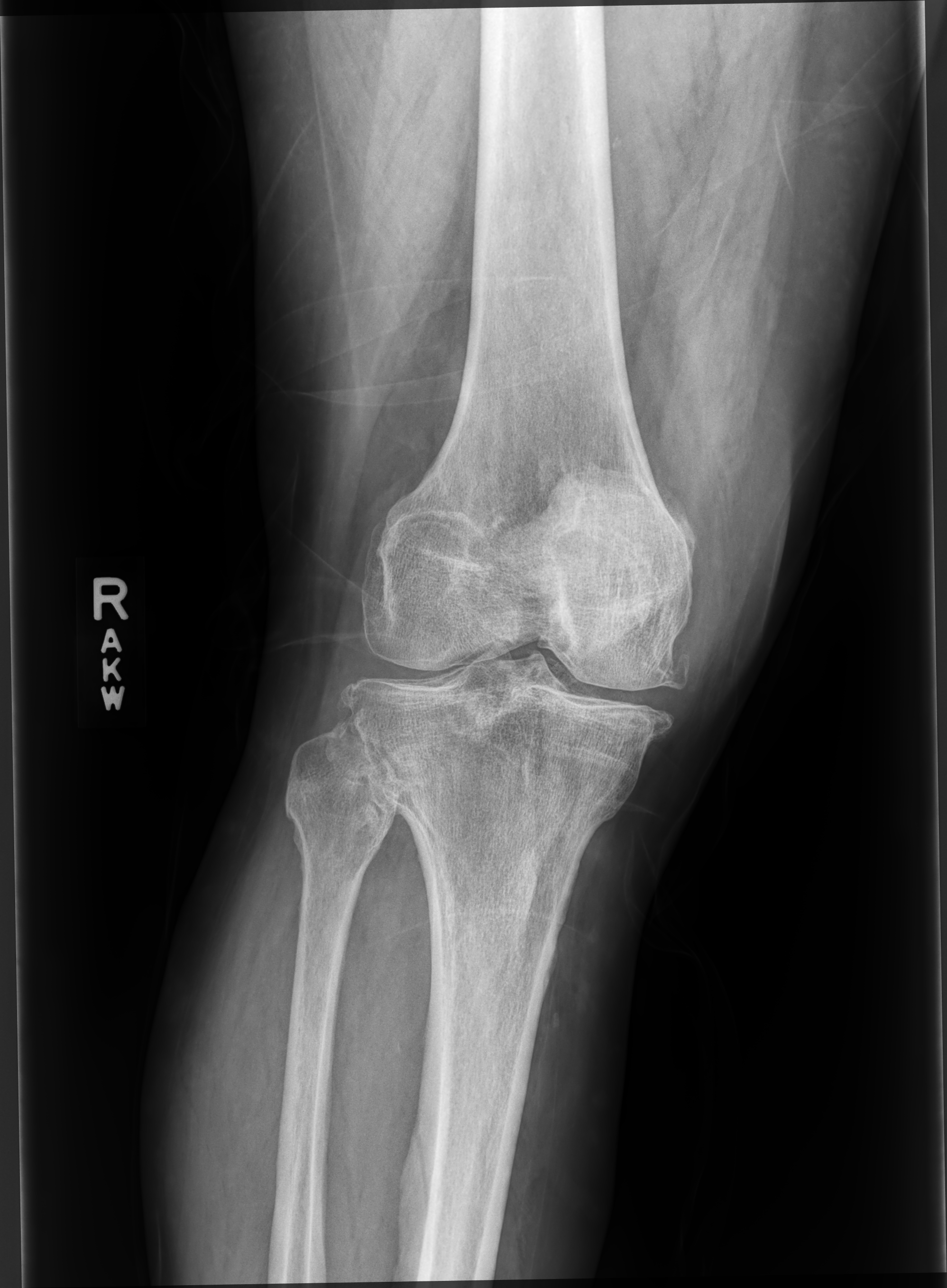

[knee lat]
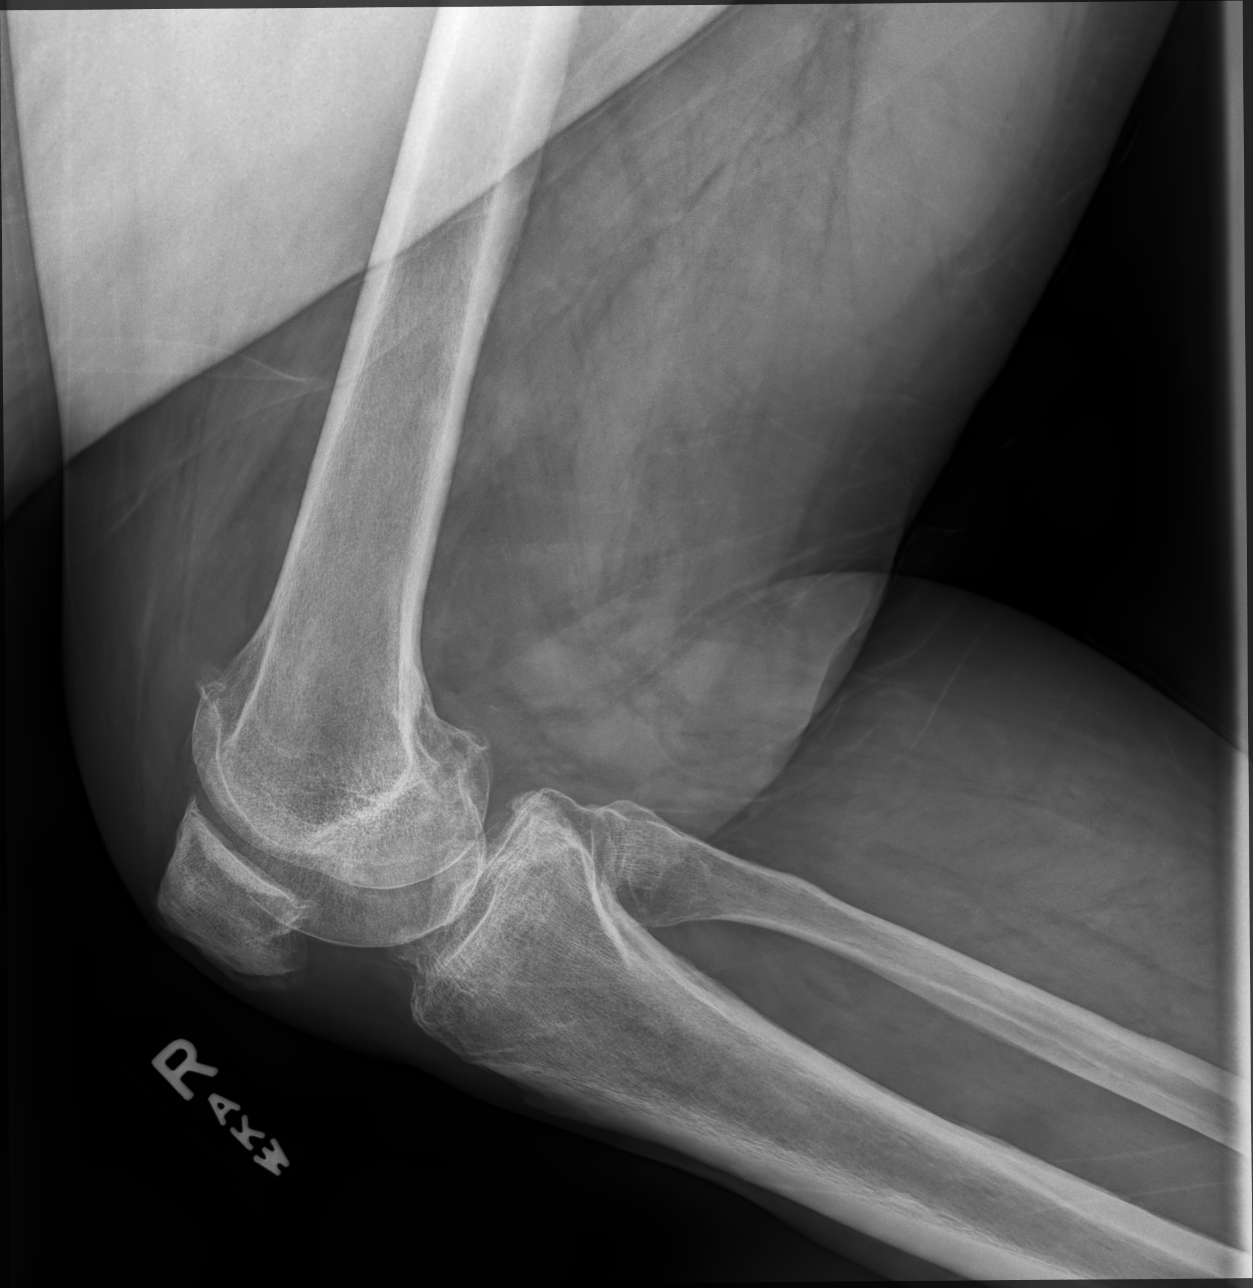

[4 of 4 positions shown; findings below may reference images not displayed]

FINDINGS: The osseous structures appear diffusely demineralized which may
limit detection of small or nondisplaced fractures. No acute
fracture or traumatic malalignment. Moderate tricompartmental
degenerative changes are present, most pronounced in the
patellofemoral compartment. There is a small suprapatellar joint
effusion poorly visualized given the flex knee of the lateral
radiograph. No suspicious osseous lesions. Remaining soft tissues
are unremarkable.
IMPRESSION: 1. No acute osseous abnormality.
2. Moderate tricompartmental degenerative changes.
3. Small suprapatellar joint effusion.

## 2021-02-20 ENCOUNTER — Other Ambulatory Visit: Payer: Self-pay | Admitting: Internal Medicine

## 2021-02-20 ENCOUNTER — Other Ambulatory Visit (INDEPENDENT_AMBULATORY_CARE_PROVIDER_SITE_OTHER): Payer: Commercial Managed Care - PPO

## 2021-02-20 ENCOUNTER — Telehealth (INDEPENDENT_AMBULATORY_CARE_PROVIDER_SITE_OTHER): Payer: Commercial Managed Care - PPO | Admitting: Internal Medicine

## 2021-02-20 ENCOUNTER — Other Ambulatory Visit: Payer: Self-pay

## 2021-02-20 ENCOUNTER — Telehealth: Payer: Self-pay

## 2021-02-20 DIAGNOSIS — E559 Vitamin D deficiency, unspecified: Secondary | ICD-10-CM

## 2021-02-20 DIAGNOSIS — E785 Hyperlipidemia, unspecified: Secondary | ICD-10-CM

## 2021-02-20 DIAGNOSIS — R3989 Other symptoms and signs involving the genitourinary system: Secondary | ICD-10-CM

## 2021-02-20 DIAGNOSIS — R6883 Chills (without fever): Secondary | ICD-10-CM

## 2021-02-20 DIAGNOSIS — E1169 Type 2 diabetes mellitus with other specified complication: Secondary | ICD-10-CM

## 2021-02-20 DIAGNOSIS — E118 Type 2 diabetes mellitus with unspecified complications: Secondary | ICD-10-CM | POA: Diagnosis not present

## 2021-02-20 DIAGNOSIS — R300 Dysuria: Secondary | ICD-10-CM | POA: Diagnosis not present

## 2021-02-20 DIAGNOSIS — I10 Essential (primary) hypertension: Secondary | ICD-10-CM

## 2021-02-20 LAB — COMPREHENSIVE METABOLIC PANEL
ALT: 30 U/L (ref 0–53)
AST: 23 U/L (ref 0–37)
Albumin: 4.1 g/dL (ref 3.5–5.2)
Alkaline Phosphatase: 86 U/L (ref 39–117)
BUN: 11 mg/dL (ref 6–23)
CO2: 33 mEq/L — ABNORMAL HIGH (ref 19–32)
Calcium: 9.6 mg/dL (ref 8.4–10.5)
Chloride: 91 mEq/L — ABNORMAL LOW (ref 96–112)
Creatinine, Ser: 0.95 mg/dL (ref 0.40–1.50)
GFR: 86.22 mL/min (ref >60.00)
Glucose, Bld: 108 mg/dL — ABNORMAL HIGH (ref 70–99)
Potassium: 3 mEq/L — ABNORMAL LOW (ref 3.5–5.1)
Sodium: 134 mEq/L — ABNORMAL LOW (ref 135–145)
Total Bilirubin: 0.9 mg/dL (ref 0.2–1.2)
Total Protein: 8.4 g/dL — ABNORMAL HIGH (ref 6.0–8.3)

## 2021-02-20 LAB — URINALYSIS, ROUTINE W REFLEX MICROSCOPIC
Ketones, ur: NEGATIVE
Nitrite: NEGATIVE
Specific Gravity, Urine: 1.02 (ref 1.000–1.030)
Total Protein, Urine: 30 — AB
Urine Glucose: NEGATIVE
Urobilinogen, UA: 4 — AB (ref 0.0–1.0)
pH: 6 (ref 5.0–8.0)

## 2021-02-20 LAB — CBC WITH DIFFERENTIAL/PLATELET
Basophils Absolute: 0.1 10*3/uL (ref 0.0–0.1)
Basophils Relative: 0.8 % (ref 0.0–3.0)
Eosinophils Absolute: 0 10*3/uL (ref 0.0–0.7)
Eosinophils Relative: 0 % (ref 0.0–5.0)
HCT: 41.1 % (ref 39.0–52.0)
Hemoglobin: 13.8 g/dL (ref 13.0–17.0)
Lymphocytes Relative: 11.4 % — ABNORMAL LOW (ref 12.0–46.0)
Lymphs Abs: 1.5 10*3/uL (ref 0.7–4.0)
MCHC: 33.6 g/dL (ref 30.0–36.0)
MCV: 87 fl (ref 78.0–100.0)
Monocytes Absolute: 1.3 10*3/uL — ABNORMAL HIGH (ref 0.1–1.0)
Monocytes Relative: 9.2 % (ref 3.0–12.0)
Neutro Abs: 10.7 10*3/uL — ABNORMAL HIGH (ref 1.4–7.7)
Neutrophils Relative %: 78.6 % — ABNORMAL HIGH (ref 43.0–77.0)
Platelets: 248 10*3/uL (ref 150.0–400.0)
RBC: 4.73 Mil/uL (ref 4.22–5.81)
RDW: 14.3 % (ref 11.5–15.5)
WBC: 13.6 10*3/uL — ABNORMAL HIGH (ref 4.0–10.5)

## 2021-02-20 MED ORDER — CIPROFLOXACIN HCL 500 MG PO TABS: 500.0000 mg | ORAL_TABLET | Freq: Two times a day (BID) | ORAL | 0 refills | Status: AC

## 2021-02-20 NOTE — Progress Notes (Signed)
Please contact patient about  Results and  medication sent in to take and plan fu next week with PCP   not sure if he will see it on my chart.  Thanks

## 2021-02-20 NOTE — Progress Notes (Signed)
So urine looks like a UTI and your white blood cell count is elevated like infection.  Potassium level slightly low kidney function normal.   I am sending in antibiotic to start for this infection  Cipro 500 mg twice a day .   Begin  today make a follow-up visit visit with Dr. Ardyth Harps next week..  If continuing fever chills and not improved in the next 2 to 3 days  or concern about medication . contact the on-call service  Consider Seek Ed care if getting worse .   Make sure you are drinking plenty of liquids

## 2021-02-20 NOTE — Telephone Encounter (Signed)
LVM for call back to discuss results. Left brief explantation of message.

## 2021-02-20 NOTE — Progress Notes (Signed)
See telephone message

## 2021-02-20 NOTE — Telephone Encounter (Signed)
Patient called back for results.

## 2021-02-20 NOTE — Progress Notes (Signed)
Virtual Visit via Video Note  I connected with@ on 02/20/21 at 10:30 AM EDT by a video enabled telemedicine application and verified that I am speaking with the correct person using two identifiers. Location patient: home Location provider: home office Persons participating in the virtual visit: patient, provider  [place] national recommendations  regarding COVID 19 pandemic   video visit is advised over in office visit for this patient.  Patient aware  of the limitations of evaluation and management by telemedicine and  availability of in person appointments. and agreed to proceed.   HPI: Jim Green presents for video visit  PCP appt.  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  He rested in bed the next day continues to work and over the last 4 to 5 days has had decreased bowel movements he feels from not eating with anorexia and most recently intermittent burning and decrease urine stream at times. With diabetes has been uncontrolled but has not checked it recently No recent COVID check had COVID infection last year and recovered totally.  No one else is sick denies significant respiratory symptoms.  ROS: See pertinent positives and negatives per HPI. No rash abd pain  Feels weak   Past Medical History:  Diagnosis Date  . Allergy   . Diabetes mellitus without complication (HCC)   . Eczema   . ED (erectile dysfunction)   . Hyperlipidemia   . Hypertension   . Obesity   . Sleep apnea     Past Surgical History:  Procedure Laterality Date  . KNEE ARTHROSCOPY     right knee  . NASAL SINUS SURGERY      Family History  Problem Relation Age of Onset  . Heart disease Other   . Hypertension Other   . Obesity Other     Social History   Tobacco Use  . Smoking status: Never Smoker  . Smokeless tobacco: Never Used  Substance Use Topics  . Alcohol use: Yes    Alcohol/week: 12.0 standard drinks     Types: 12 Standard drinks or equivalent per week  . Drug use: No      Current Outpatient Medications:  .  amLODipine (NORVASC) 5 MG tablet, Take 1 tablet (5 mg total) by mouth daily., Disp: 90 tablet, Rfl: 1 .  cyanocobalamin (,VITAMIN B-12,) 1000 MCG/ML injection, INJECT 1 ML (CC) ONCE EVERY MONTH IN THE MUSCLE, Disp: 1 mL, Rfl: 5 .  cyclobenzaprine (FLEXERIL) 5 MG tablet, Take 1 tablet (5 mg total) by mouth 3 (three) times daily as needed for muscle spasms., Disp: 30 tablet, Rfl: 0 .  diclofenac (VOLTAREN) 75 MG EC tablet, Take 1 tablet (75 mg total) by mouth 2 (two) times daily., Disp: 60 tablet, Rfl: 0 .  hydrochlorothiazide (HYDRODIURIL) 25 MG tablet, Take 1 tablet (25 mg total) by mouth daily., Disp: 90 tablet, Rfl: 0 .  ibuprofen (ADVIL,MOTRIN) 600 MG tablet, Take 1 tablet (600 mg total) by mouth every 6 (six) hours as needed., Disp: 30 tablet, Rfl: 0 .  lisinopril (ZESTRIL) 40 MG tablet, Take 1 tablet (40 mg total) by mouth daily., Disp: 90 tablet, Rfl: 0 .  metFORMIN (GLUCOPHAGE) 500 MG tablet, TAKE 1 TABLET BY MOUTH ONCE DAILY WITH BREAKFAST . APPOINTMENT REQUIRED FOR FUTURE REFILLS, Disp: 90 tablet, Rfl: 0 .  NEEDLE, DISP, 25 G (B-D DISP NEEDLE 25GX1") 25G X 1" MISC, Inject 1,000 mcg of B12 once  a month for 6 months., Disp: 1 each, Rfl: 5 .  simvastatin (ZOCOR) 40 MG tablet, TAKE 1 TABLET BY MOUTH IN THE MORNING WITH BREAKFAST, Disp: 90 tablet, Rfl: 0 .  potassium chloride SA (KLOR-CON) 20 MEQ tablet, Take 2 tablets (40 mEq total) by mouth daily for 5 days., Disp: 10 tablet, Rfl: 0  EXAM: BP Readings from Last 3 Encounters:  11/20/20 140/90  06/24/20 (!) 187/93  06/12/20 (!) 160/90    VITALS per patient if applicable:  GENERAL: alert, oriented, appears well and in no acute distress  HEENT: atraumatic, conjunttiva clear, no obvious abnormalities on inspection of external nose and ears  NECK: normal movements of the head and neck  LUNGS: on inspection no signs of  respiratory distress, breathing rate appears normal, no obvious gross SOB, gasping or wheezing  CV: no obvious cyanosis  MS: moves all visible extremities without noticeable abnormality  PSYCH/NEURO: pleasant and cooperative, no obvious depression or anxiety, speech and thought processing grossly intact Lab Results  Component Value Date   WBC 3.4 (L) 11/20/2020   HGB 13.8 11/20/2020   HCT 41.7 11/20/2020   PLT 171.0 11/20/2020   GLUCOSE 105 (H) 11/20/2020   CHOL 152 11/20/2020   TRIG 60.0 11/20/2020   HDL 52.60 11/20/2020   LDLDIRECT 165.1 10/02/2009   LDLCALC 88 11/20/2020   ALT 15 11/20/2020   AST 19 11/20/2020   NA 138 11/20/2020   K 3.9 11/20/2020   CL 100 11/20/2020   CREATININE 0.87 11/20/2020   BUN 19 11/20/2020   CO2 33 (H) 11/20/2020   TSH 2.18 11/20/2020   PSA 2.00 08/15/2019   HGBA1C 6.2 11/20/2020   MICROALBUR 0.2 01/01/2014    ASSESSMENT AND PLAN:  Discussed the following assessment and plan:    ICD-10-CM   1. Chills  R68.83 Urinalysis, Routine w reflex microscopic    Urine Culture    CBC with Differential/Platelet    Comprehensive metabolic panel  2. Suspected UTI  R39.89 Urinalysis, Routine w reflex microscopic    Urine Culture    CBC with Differential/Platelet    Comprehensive metabolic panel  3. Dysuria  R30.0 Urinalysis, Routine w reflex microscopic    Urine Culture    CBC with Differential/Platelet    Comprehensive metabolic panel  4. DM type 2, controlled, with complication (HCC)  E11.8 Urinalysis, Routine w reflex microscopic    Urine Culture    CBC with Differential/Platelet    Comprehensive metabolic panel   Concern about urinary tract infection or similar.  He will do a home COVID test but probably not the cause.  Bowel and bladder changes he feels are from decreased appetite decreased intake and but the urinary symptoms are new could be suspicious for UTI and not obstruction. Plan urinary sample and blood work today asap .  If  consistent will begin on antibiotic For worsening symptoms alarm seek emergent  Or ed care get back with [place].   No work today or tomorrow  Can return on Monday if better  Will  need fu with PCP .  Counseled.   Expectant management and discussion of plan and treatment with opportunity to ask questions and all were answered. The patient agreed with the plan and demonstrated an understanding of the instructions.   Advised to call back or seek an in-person evaluation if worsening  or having  further concerns . In interim  Return for depending on results and how doing.    Berniece Andreas, MD

## 2021-02-21 NOTE — Telephone Encounter (Signed)
Patient is aware of lab results and a follow up appointment scheduled.

## 2021-02-22 LAB — URINE CULTURE
MICRO NUMBER:: 11854468
SPECIMEN QUALITY:: ADEQUATE

## 2021-02-22 NOTE — Progress Notes (Signed)
Urine culture confirms  urine infection . The antibiotic should  help cure the infection  but you should have a follow up visit   . Please make appt with Dr  your PCP  next week for recheck   [facility] you are feeling better

## 2021-02-25 ENCOUNTER — Telehealth: Payer: Self-pay | Admitting: Internal Medicine

## 2021-02-25 NOTE — Telephone Encounter (Signed)
Pt returned the call to the office. 

## 2021-02-26 NOTE — Telephone Encounter (Signed)
Reviewed results with patient. 

## 2021-02-27 ENCOUNTER — Other Ambulatory Visit: Payer: Self-pay

## 2021-02-28 ENCOUNTER — Encounter: Payer: Self-pay | Admitting: Internal Medicine

## 2021-02-28 ENCOUNTER — Ambulatory Visit (INDEPENDENT_AMBULATORY_CARE_PROVIDER_SITE_OTHER): Payer: Commercial Managed Care - PPO | Admitting: Internal Medicine

## 2021-02-28 VITALS — BP 120/84 | HR 79 | Temp 98.7°F | Wt 253.8 lb

## 2021-02-28 DIAGNOSIS — E785 Hyperlipidemia, unspecified: Secondary | ICD-10-CM

## 2021-02-28 DIAGNOSIS — I10 Essential (primary) hypertension: Secondary | ICD-10-CM | POA: Diagnosis not present

## 2021-02-28 DIAGNOSIS — G8929 Other chronic pain: Secondary | ICD-10-CM

## 2021-02-28 DIAGNOSIS — M546 Pain in thoracic spine: Secondary | ICD-10-CM | POA: Diagnosis not present

## 2021-02-28 DIAGNOSIS — E1169 Type 2 diabetes mellitus with other specified complication: Secondary | ICD-10-CM | POA: Diagnosis not present

## 2021-02-28 DIAGNOSIS — E118 Type 2 diabetes mellitus with unspecified complications: Secondary | ICD-10-CM | POA: Diagnosis not present

## 2021-02-28 LAB — POCT GLYCOSYLATED HEMOGLOBIN (HGB A1C): Hemoglobin A1C: 6.3 % — AB (ref 4.0–5.6)

## 2021-02-28 MED ORDER — CYCLOBENZAPRINE HCL 5 MG PO TABS: 5.0000 mg | ORAL_TABLET | Freq: Every evening | ORAL | 0 refills | Status: DC | PRN

## 2021-02-28 NOTE — Progress Notes (Signed)
Established Patient Office Visit     This visit occurred during the SARS-CoV-2 public health emergency.  Safety protocols were in place, including screening questions prior to the visit, additional usage of staff PPE, and extensive cleaning of exam room while observing appropriate contact time as indicated for disinfecting solutions.    CC/Reason for Visit: Follow-up chronic conditions, discuss an acute concern  HPI: Blong Busk is a 60 y.o. male who is coming in today for the above mentioned reasons. Past Medical History is significant for: Hyperlipidemia, hypertension, type 2 diabetes as well as vitamin D and vitamin B12 deficiencies.  He was seen in this office by another provider on May 5.  He was diagnosed with an E. coli UTI and has about 3 days left of Cipro.  He states that his urinary symptoms are almost completely resolved.  He however has been having some acute thoracic back pain.  He has a history of chronic back issues.  Hurts with twisting motions of the torso, like turning over in bed.   Past Medical/Surgical History: Past Medical History:  Diagnosis Date  . Allergy   . Diabetes mellitus without complication (HCC)   . Eczema   . ED (erectile dysfunction)   . Hyperlipidemia   . Hypertension   . Obesity   . Sleep apnea     Past Surgical History:  Procedure Laterality Date  . KNEE ARTHROSCOPY     right knee  . NASAL SINUS SURGERY      Social History:  reports that he has never smoked. He has never used smokeless tobacco. He reports current alcohol use of about 12.0 standard drinks of alcohol per week. He reports that he does not use drugs.  Allergies: No Known Allergies  Family History:  Family History  Problem Relation Age of Onset  . Heart disease Other   . Hypertension Other   . Obesity Other      Current Outpatient Medications:  .  amLODipine (NORVASC) 5 MG tablet, Take 1 tablet (5 mg total) by mouth daily., Disp: 90 tablet, Rfl: 1 .   ciprofloxacin (CIPRO) 500 MG tablet, Take 1 tablet (500 mg total) by mouth 2 (two) times daily for 10 days., Disp: 20 tablet, Rfl: 0 .  cyanocobalamin (,VITAMIN B-12,) 1000 MCG/ML injection, INJECT 1 ML (CC) ONCE EVERY MONTH IN THE MUSCLE, Disp: 1 mL, Rfl: 5 .  cyclobenzaprine (FLEXERIL) 5 MG tablet, Take 1 tablet (5 mg total) by mouth at bedtime as needed for muscle spasms., Disp: 30 tablet, Rfl: 0 .  diclofenac (VOLTAREN) 75 MG EC tablet, Take 1 tablet (75 mg total) by mouth 2 (two) times daily., Disp: 60 tablet, Rfl: 0 .  hydrochlorothiazide (HYDRODIURIL) 25 MG tablet, Take 1 tablet by mouth once daily, Disp: 90 tablet, Rfl: 0 .  ibuprofen (ADVIL,MOTRIN) 600 MG tablet, Take 1 tablet (600 mg total) by mouth every 6 (six) hours as needed., Disp: 30 tablet, Rfl: 0 .  lisinopril (ZESTRIL) 40 MG tablet, Take 1 tablet by mouth once daily, Disp: 30 tablet, Rfl: 0 .  metFORMIN (GLUCOPHAGE) 500 MG tablet, TAKE 1 TABLET BY MOUTH ONCE DAILY WITH BREAKFAST . APPOINTMENT REQUIRED FOR FUTURE REFILLS, Disp: 90 tablet, Rfl: 0 .  NEEDLE, DISP, 25 G (B-D DISP NEEDLE 25GX1") 25G X 1" MISC, Inject 1,000 mcg of B12 once a month for 6 months., Disp: 1 each, Rfl: 5 .  simvastatin (ZOCOR) 40 MG tablet, TAKE 1 TABLET BY MOUTH IN THE MORNING WITH BREAKFAST .  APPOINTMENT REQUIRED FOR FUTURE REFILLS, Disp: 90 tablet, Rfl: 0 .  Vitamin D, Ergocalciferol, (DRISDOL) 1.25 MG (50000 UNIT) CAPS capsule, TAKE 1 CAPSULE BY MOUTH ONCE A WEEK 12  DOSES, Disp: 12 capsule, Rfl: 0 .  potassium chloride SA (KLOR-CON) 20 MEQ tablet, Take 2 tablets (40 mEq total) by mouth daily for 5 days., Disp: 10 tablet, Rfl: 0  Review of Systems:  Constitutional: Denies fever, chills, diaphoresis, appetite change and fatigue.  HEENT: Denies photophobia, eye pain, redness, hearing loss, ear pain, congestion, sore throat, rhinorrhea, sneezing, mouth sores, trouble swallowing, neck pain, neck stiffness and tinnitus.   Respiratory: Denies SOB, DOE, cough,  chest tightness,  and wheezing.   Cardiovascular: Denies chest pain, palpitations and leg swelling.  Gastrointestinal: Denies nausea, vomiting, abdominal pain, diarrhea, constipation, blood in stool and abdominal distention.  Genitourinary: Denies dysuria, urgency, frequency, hematuria, flank pain and difficulty urinating.  Endocrine: Denies: hot or cold intolerance, sweats, changes in hair or nails, polyuria, polydipsia. Musculoskeletal: Denies  joint swelling, arthralgias and gait problem.  Skin: Denies pallor, rash and wound.  Neurological: Denies dizziness, seizures, syncope, weakness, light-headedness, numbness and headaches.  Hematological: Denies adenopathy. Easy bruising, personal or family bleeding history  Psychiatric/Behavioral: Denies suicidal ideation, mood changes, confusion, nervousness, sleep disturbance and agitation    Physical Exam: Vitals:   02/28/21 1356  BP: 120/84  Pulse: 79  Temp: 98.7 F (37.1 C)  TempSrc: Oral  SpO2: 97%  Weight: 253 lb 12.8 oz (115.1 kg)    Body mass index is 38.59 kg/m.   Constitutional: NAD, calm, comfortable, obese Eyes: PERRL, lids and conjunctivae normal ENMT: Mucous membranes are moist.  Respiratory: clear to auscultation bilaterally, no wheezing, no crackles. Normal respiratory effort. No accessory muscle use.  Cardiovascular: Regular rate and rhythm, no murmurs / rubs / gallops. No extremity edema.  Neurologic: Grossly intact and nonfocal Psychiatric: Normal judgment and insight. Alert and oriented x 3. Normal mood.    Impression and Plan:  DM type 2, controlled, with complication (HCC)  -A1c in office today is 6.3 demonstrating good control.  Essential hypertension -Well-controlled.  Hyperlipidemia associated with type 2 diabetes mellitus (HCC) -LDL is not quite at goal.  Consider increasing medication next visit. -He is currently on simvastatin 40 mg daily.  Can consider adding ezetimibe next visit.  Chronic  left-sided thoracic back pain  - Plan: cyclobenzaprine (FLEXERIL) 5 MG tablet -Suspect musculoskeletal. -Have advised icing, as needed NSAIDs, local massage therapy and as needed muscle relaxers at bedtime.   Patient Instructions  -Nice seeing you today!!  -Take flexeril 5 mg at bedtime as needed for back spasms.  -For your back: icing, ibuprofen as needed, local massage therapy. If not better in 1 week can consider physical therapy.  -Schedule follow up in 3 months.     Chaya Jan, MD [facility] at [facility]

## 2021-02-28 NOTE — Patient Instructions (Signed)
-  Nice seeing you today!!  -Take flexeril 5 mg at bedtime as needed for back spasms.  -For your back: icing, ibuprofen as needed, local massage therapy. If not better in 1 week can consider physical therapy.  -Schedule follow up in 3 months.

## 2021-03-05 ENCOUNTER — Encounter (HOSPITAL_COMMUNITY): Payer: Self-pay

## 2021-03-05 ENCOUNTER — Ambulatory Visit (HOSPITAL_COMMUNITY)
Admission: EM | Admit: 2021-03-05 | Discharge: 2021-03-05 | Disposition: A | Discharge status: Home / Self Care | Payer: Commercial Managed Care - PPO | Attending: Family Medicine | Admitting: Family Medicine

## 2021-03-05 ENCOUNTER — Other Ambulatory Visit: Payer: Self-pay

## 2021-03-05 DIAGNOSIS — M722 Plantar fascial fibromatosis: Secondary | ICD-10-CM | POA: Diagnosis not present

## 2021-03-05 DIAGNOSIS — M79671 Pain in right foot: Secondary | ICD-10-CM | POA: Diagnosis not present

## 2021-03-05 MED ORDER — MELOXICAM 15 MG PO TABS: 15.0000 mg | ORAL_TABLET | Freq: Every day | ORAL | 0 refills | Status: DC

## 2021-03-05 NOTE — ED Provider Notes (Signed)
[facility]   [id number] 03/05/21 Arrival Time: 1003  ASSESSMENT & PLAN:  1. Right foot pain   2. Plantar fasciitis    No indication for plain imaging today. Discussed. No signs of bacterial skin infection. No wounds. See AVS for d/c information. Work note provided.  Meds ordered this encounter  Medications  . meloxicam (MOBIC) 15 MG tablet    Sig: Take 1 tablet (15 mg total) by mouth daily.    Dispense:  10 tablet    Refill:  0    Orders Placed This Encounter  Procedures  . Apply CAM boot    Recommend:  Follow-up Information    Schedule an appointment as soon as possible for a visit  with [facility].   Contact information: [address] [place] [address] [phone]              Reviewed expectations re: course of current medical issues. Questions answered. Outlined signs and symptoms indicating need for more acute intervention. Patient verbalized understanding. After Visit Summary given.  SUBJECTIVE: History from: patient. Jim Green is a 60 y.o. male with DM who reports persistent RIGHT plantar foot pain; noted 2 d ago. Worse with attempted wt bearing; better with rest. No swelling. No wounds/injury reported. No extremity sensation changes or weakness. No tx PTA.  Past Surgical History:  Procedure Laterality Date  . KNEE ARTHROSCOPY     right knee  . NASAL SINUS SURGERY        OBJECTIVE:  Vitals:   03/05/21 1123  BP: 132/85  Pulse: 86  Resp: 18  TempSrc: Oral  SpO2: 96%    General appearance: alert; no distress HEENT: [place]; AT Neck: supple with FROM Resp: unlabored respirations Extremities: . RLE: warm with well perfused appearance; fairly well localized marked tenderness over right plantar foot; without gross deformities; swelling: none; bruising: none; toes and ankle with FROM CV: brisk extremity capillary refill of RLE; 2+ DP pulse of RLE. Skin: warm and dry; no  visible rashes Neurologic: normal sensation and strength of RLE Psychological: alert and cooperative; normal mood and affect    No Known Allergies  Past Medical History:  Diagnosis Date  . Allergy   . Diabetes mellitus without complication (HCC)   . Eczema   . ED (erectile dysfunction)   . Hyperlipidemia   . Hypertension   . Obesity   . Sleep apnea    Social History   Socioeconomic History  . Marital status: Single    Spouse name: Not on file  . Number of children: Not on file  . Years of education: Not on file  . Highest education level: Not on file  Occupational History  . Not on file  Tobacco Use  . Smoking status: Never Smoker  . Smokeless tobacco: Never Used  Substance and Sexual Activity  . Alcohol use: Yes    Alcohol/week: 12.0 standard drinks    Types: 12 Standard drinks or equivalent per week  . Drug use: No  . Sexual activity: Not on file  Other Topics Concern  . Not on file  Social History Narrative  . Not on file   Social Determinants of Health   Financial Resource Strain: Not on file  Food Insecurity: Not on file  Transportation Needs: Not on file  Physical Activity: Not on file  Stress: Not on file  Social Connections: Not on file   Family History  Problem Relation Age of Onset  .  Heart disease Other   . Hypertension Other   . Obesity Other    Past Surgical History:  Procedure Laterality Date  . KNEE ARTHROSCOPY     right knee  . NASAL SINUS SURGERY        Mardella Layman, MD 03/05/21 1136

## 2021-03-05 NOTE — ED Triage Notes (Signed)
Pt in with c/o right foot pain x 2 days  Pt has not taken medication for sx

## 2021-03-07 ENCOUNTER — Ambulatory Visit (INDEPENDENT_AMBULATORY_CARE_PROVIDER_SITE_OTHER): Payer: Commercial Managed Care - PPO | Admitting: Family Medicine

## 2021-03-07 ENCOUNTER — Other Ambulatory Visit: Payer: Self-pay

## 2021-03-07 ENCOUNTER — Encounter: Payer: Self-pay | Admitting: Family Medicine

## 2021-03-07 VITALS — BP 116/74 | Ht 68.0 in | Wt 253.0 lb

## 2021-03-07 DIAGNOSIS — G5791 Unspecified mononeuropathy of right lower limb: Secondary | ICD-10-CM

## 2021-03-07 MED ORDER — GABAPENTIN 300 MG PO CAPS: ORAL_CAPSULE | ORAL | 1 refills | Status: DC

## 2021-03-07 NOTE — Progress Notes (Signed)
   PCP: Philip Aspen, Limmie Patricia, MD  Subjective:   HPI: Patient is a 60 y.o. male with history of well-controlled DM2 with recent A1c of 6.3 here for evaluation of right foot pain.  He reports that about a week ago, he started having exquisite pain on the bottom of his right foot.  He did not have any new trauma or injury prior to the onset of his symptoms.  He does note that in the months prior, he was having a strange sensation on the bottom of both feet when he put his socks on, but did not think much of it.  The pain is mostly over the lateral aspect of his plantar foot extending into the balls of his foot.  He describes as a sharp burning and tingling sensation.  Worse with walking.  He has not tried anything for this so far.  He did recently get some new work shoes, but they are the same [employer] that he is used previously.  No new activities or increase in activity.  He does have a history of hallux valgus and has seen podiatry for this, otherwise no significant history of foot injuries.   Review of Systems:  Per HPI.   PMFSH, medications and smoking status reviewed.      Objective:  Physical Exam:  No flowsheet data found.   Gen: awake, alert, NAD, comfortable in exam room Pulm: breathing unlabored  Right foot: -Inspection: Patient with significant collapse of longitudinal arch to pes planus.  He also has collapse of the longitudinal arch bilaterally.  Moderate to severe hallux valgus which is worse on the left.  On examination of his plantar foot, he has significant callus formation diffusely across all metatarsal heads.  Onychomycosis noted.  With walking, patient has extreme antalgic gait due to pain with weightbearing. -Palpation: Tenderness to both light and deep palpation along the lateral aspect of the plantar foot extending into the lateral 3 metatarsal heads.  Very mildly tender along the lateral and midline of the heel.  Nontender palpation of the medial heel,  nontender to palpation of the Achilles. -ROM: Full range of motion of the foot and ankle -Strength: 5/5 strength in all planes of the ankle -Sensation: Mildly decreased sensation along the plantar aspect of the right foot compared to contralateral side. -Special: Negative Tinel's of the tibial nerve medially   Assessment & Plan:  1.  Right foot plantar neuropathy Differential includes diabetic peripheral neuropathy versus distal nerve impingement versus lumbar radiculopathy in the S1 distribution versus other metabolic peripheral neuropathy.  Given that the pain is in the lateral plantar cutaneous distribution, suspect that he may have nerve impingement of the lateral plantar cutaneous nerve as it traverses the medial retinaculum.  This may also simply be diabetic neuropathy note is unusual for it to be so sudden and unilateral in a well controlled diabetic patient.  Plan: -Offered diagnostic and therapeutic injection of the lateral plantar cutaneous nerve, patient would like to hold off on this for now -Start gabapentin 300 mg at night and titrate up to 300 mg twice daily -Follow-up in 2 weeks, at that time patient instructed to bring his work shoes in his regular shoes, would consider adding arch support.  Also would consider diagnostic/therapeutic injection as noted above if continued symptoms.   Guy Sandifer, MD [facility] Fellow 03/07/2021 10:57 AM

## 2021-03-07 NOTE — Progress Notes (Signed)
[facility]: Attending Note: I have reviewed the chart, discussed wit the Sports Medicine Fellow. I agree with assessment and treatment plan as detailed in the Fellow's note.  

## 2021-03-07 NOTE — Patient Instructions (Signed)
Thank you for coming in to see [place] today! Please see below to review our plan for today's visit:   Please start taking gabapentin 300 mg at night for the next 2 to 3 days, and then increase to 300 mg 2 times per day.  Please monitor for sleepiness as this is the main side effect of the medication.  Lets plan to follow-up in 2 to 3 weeks, sooner if you have any concerns.  At the next visit, please bring your work shoes so these can be evaluated as well.   Please call the clinic at [phone] if your symptoms worsen or you have any concerns. It was our pleasure to serve you.       Dr. Guy Sandifer Dr. [facility]

## 2021-04-17 ENCOUNTER — Other Ambulatory Visit: Payer: Self-pay | Admitting: Internal Medicine

## 2021-04-17 DIAGNOSIS — I10 Essential (primary) hypertension: Secondary | ICD-10-CM

## 2021-06-03 ENCOUNTER — Other Ambulatory Visit: Payer: Self-pay | Admitting: Internal Medicine

## 2021-06-03 DIAGNOSIS — E1169 Type 2 diabetes mellitus with other specified complication: Secondary | ICD-10-CM

## 2021-06-03 DIAGNOSIS — E118 Type 2 diabetes mellitus with unspecified complications: Secondary | ICD-10-CM

## 2021-06-03 DIAGNOSIS — I10 Essential (primary) hypertension: Secondary | ICD-10-CM

## 2021-06-18 ENCOUNTER — Other Ambulatory Visit: Payer: Self-pay | Admitting: Internal Medicine

## 2021-06-18 DIAGNOSIS — I10 Essential (primary) hypertension: Secondary | ICD-10-CM

## 2021-07-18 ENCOUNTER — Other Ambulatory Visit: Payer: Self-pay | Admitting: Internal Medicine

## 2021-07-18 DIAGNOSIS — I10 Essential (primary) hypertension: Secondary | ICD-10-CM

## 2021-10-11 ENCOUNTER — Other Ambulatory Visit: Payer: Self-pay | Admitting: Internal Medicine

## 2021-10-11 DIAGNOSIS — I10 Essential (primary) hypertension: Secondary | ICD-10-CM

## 2021-10-21 IMAGING — DX DG RIBS 2V*L*
2 series · 2 of 2 positions shown · non-contrast
Comparison: Chest radiograph June 12, 2018

CLINICAL DATA: Pain

EXAM:
LEFT RIBS - 2 VIEW

[rib ap]
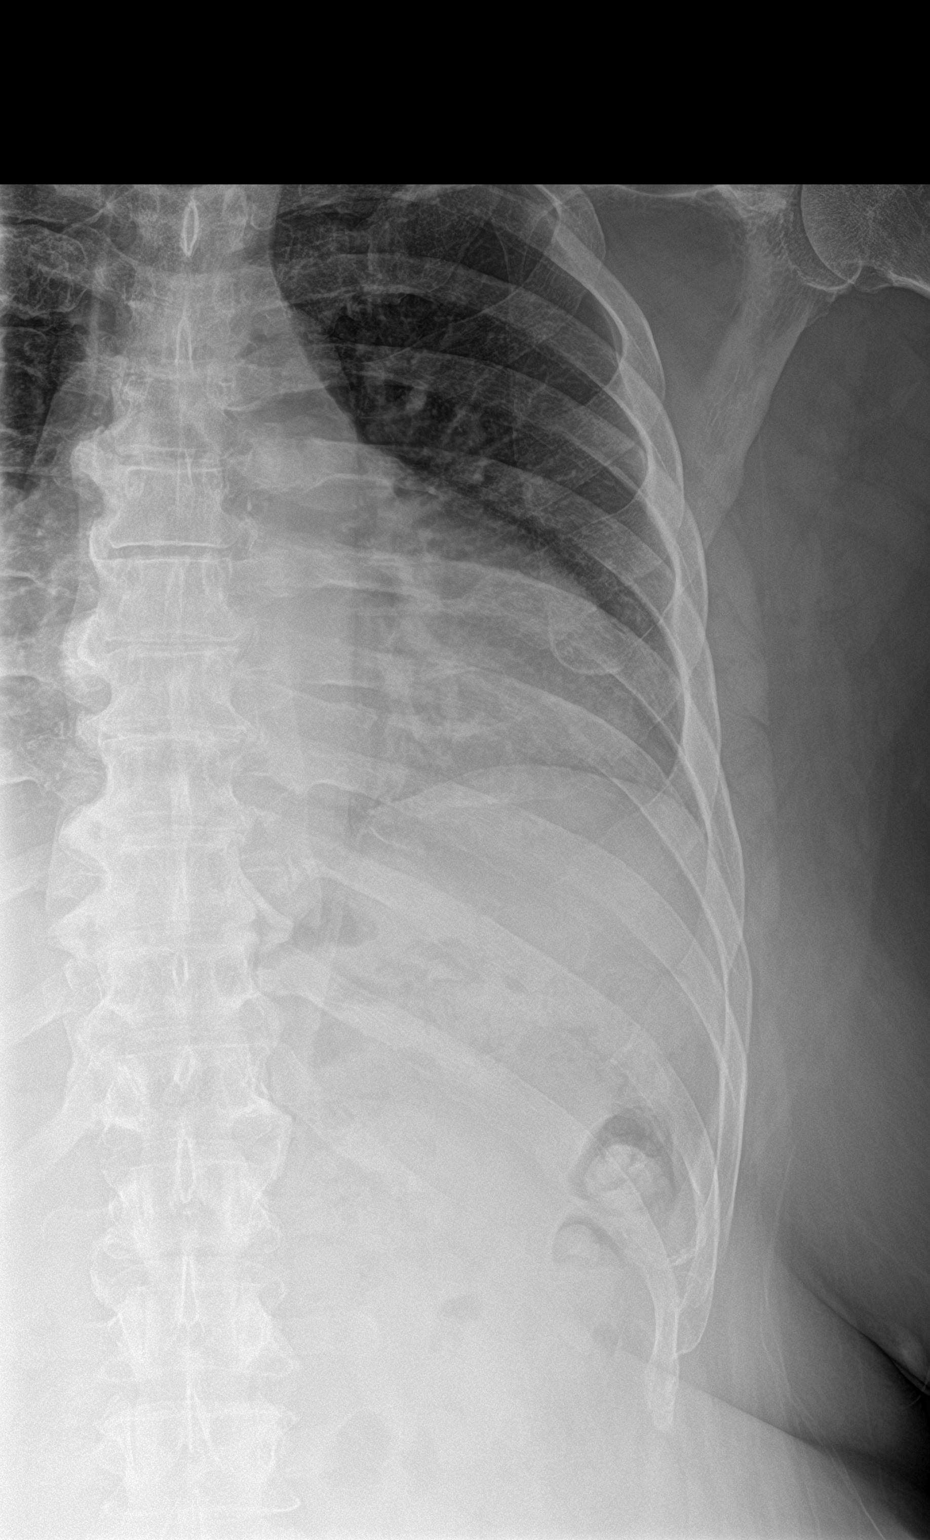

[rib obl]
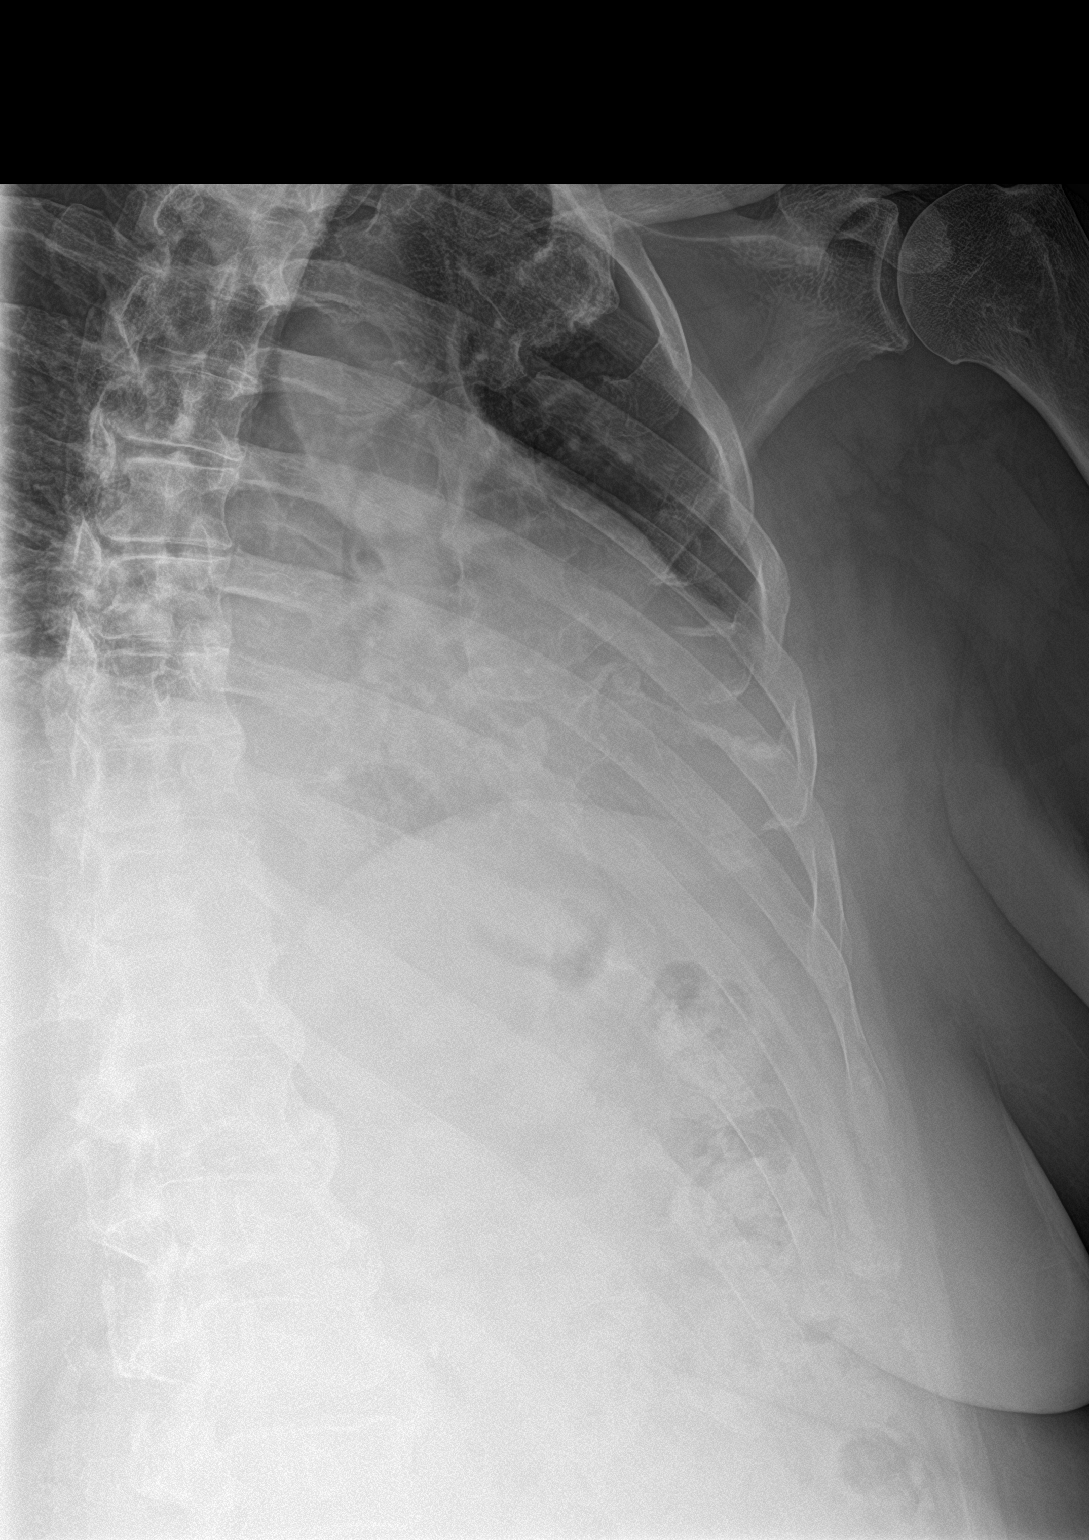

[2 of 2 positions shown; findings below may reference images not displayed]

FINDINGS: Oblique and frontal views obtained. The left lung is clear. Heart
size is within normal limits. No evident pneumothorax or pleural
effusion on the left. No evident rib fracture.
IMPRESSION: No evident rib fracture.  Left lung clear.

## 2021-12-15 ENCOUNTER — Other Ambulatory Visit: Payer: Self-pay | Admitting: Internal Medicine

## 2021-12-15 DIAGNOSIS — I10 Essential (primary) hypertension: Secondary | ICD-10-CM

## 2021-12-15 DIAGNOSIS — E785 Hyperlipidemia, unspecified: Secondary | ICD-10-CM

## 2021-12-15 DIAGNOSIS — E1169 Type 2 diabetes mellitus with other specified complication: Secondary | ICD-10-CM

## 2021-12-16 ENCOUNTER — Other Ambulatory Visit: Payer: Self-pay | Admitting: Internal Medicine

## 2021-12-16 DIAGNOSIS — E118 Type 2 diabetes mellitus with unspecified complications: Secondary | ICD-10-CM

## 2021-12-30 ENCOUNTER — Other Ambulatory Visit: Payer: Self-pay | Admitting: Internal Medicine

## 2021-12-30 DIAGNOSIS — I10 Essential (primary) hypertension: Secondary | ICD-10-CM

## 2022-01-07 ENCOUNTER — Ambulatory Visit: Payer: Commercial Managed Care - PPO | Admitting: Internal Medicine

## 2022-01-07 ENCOUNTER — Ambulatory Visit (HOSPITAL_COMMUNITY)
Admission: RE | Admit: 2022-01-07 | Discharge: 2022-01-07 | Disposition: A | Discharge status: Home / Self Care | Payer: Commercial Managed Care - PPO | Source: Ambulatory Visit | Attending: Internal Medicine | Admitting: Internal Medicine

## 2022-01-07 ENCOUNTER — Other Ambulatory Visit: Payer: Self-pay

## 2022-01-07 ENCOUNTER — Encounter: Payer: Self-pay | Admitting: Internal Medicine

## 2022-01-07 VITALS — BP 120/84 | HR 87 | Temp 98.8°F | Wt 262.3 lb

## 2022-01-07 DIAGNOSIS — Z1211 Encounter for screening for malignant neoplasm of colon: Secondary | ICD-10-CM | POA: Diagnosis not present

## 2022-01-07 DIAGNOSIS — R059 Cough, unspecified: Secondary | ICD-10-CM

## 2022-01-07 DIAGNOSIS — J069 Acute upper respiratory infection, unspecified: Secondary | ICD-10-CM

## 2022-01-07 DIAGNOSIS — R600 Localized edema: Secondary | ICD-10-CM | POA: Diagnosis present

## 2022-01-07 DIAGNOSIS — M25561 Pain in right knee: Secondary | ICD-10-CM

## 2022-01-07 DIAGNOSIS — E118 Type 2 diabetes mellitus with unspecified complications: Secondary | ICD-10-CM

## 2022-01-07 LAB — POCT INFLUENZA A/B
Influenza A, POC: NEGATIVE
Influenza B, POC: NEGATIVE

## 2022-01-07 LAB — POCT GLYCOSYLATED HEMOGLOBIN (HGB A1C): Hemoglobin A1C: 5.9 % — AB (ref 4.0–5.6)

## 2022-01-07 LAB — POC COVID19 BINAXNOW: SARS Coronavirus 2 Ag: NEGATIVE

## 2022-01-07 MED ORDER — GABAPENTIN 300 MG PO CAPS: ORAL_CAPSULE | ORAL | 1 refills | Status: DC

## 2022-01-07 NOTE — Progress Notes (Signed)
? ? ? ?Established Patient Office Visit ? ? ? ? ?This visit occurred during the SARS-CoV-2 public health emergency.  Safety protocols were in place, including screening questions prior to the visit, additional usage of staff PPE, and extensive cleaning of exam room while observing appropriate contact time as indicated for disinfecting solutions.  ? ? ?CC/Reason for Visit: Discuss some acute concerns ? ?HPI: Jim Green is a 61 y.o. male who is coming in today for the above mentioned reasons.  Here today to discuss 2 main issues: For the past 2 days he has been having some cough with sinus drainage, is improved today as compared to yesterday.  He has also been noticing some right knee and leg edema for the past week.  No injury that he can recall.  He has been told he has osteoarthritis of the knee but he became concerned when swelling extended down into his foot. ? ?Past Medical/Surgical History: ?Past Medical History:  ?Diagnosis Date  ? Allergy   ? Diabetes mellitus without complication ([place])   ? Eczema   ? ED (erectile dysfunction)   ? Hyperlipidemia   ? Hypertension   ? Obesity   ? Sleep apnea   ? ? ?Past Surgical History:  ?Procedure Laterality Date  ? KNEE ARTHROSCOPY    ? right knee  ? NASAL SINUS SURGERY    ? ? ?Social History: ? reports that he has never smoked. He has never used smokeless tobacco. He reports current alcohol use of about 12.0 standard drinks per week. He reports that he does not use drugs. ? ?Allergies: ?No Known Allergies ? ?Family History:  ?Family History  ?Problem Relation Age of Onset  ? Heart disease Other   ? Hypertension Other   ? Obesity Other   ? ? ? ?Current Outpatient Medications:  ?  amLODipine (NORVASC) 5 MG tablet, Take 1 tablet by mouth once daily, Disp: 90 tablet, Rfl: 0 ?  cyanocobalamin (,VITAMIN B-12,) 1000 MCG/ML injection, INJECT 1 ML ONCE EVERY MONTH IN THE MUSCLE, Disp: 1 mL, Rfl: 0 ?  hydrochlorothiazide (HYDRODIURIL) 25 MG tablet, Take 1 tablet by mouth once  daily, Disp: 90 tablet, Rfl: 0 ?  lisinopril (ZESTRIL) 40 MG tablet, Take 1 tablet by mouth once daily, Disp: 90 tablet, Rfl: 0 ?  metFORMIN (GLUCOPHAGE) 500 MG tablet, Take 1 tablet by mouth once daily with breakfast, Disp: 90 tablet, Rfl: 0 ?  NEEDLE, DISP, 25 G (B-D DISP NEEDLE 25GX1") 25G X 1" MISC, Inject 1,000 mcg of B12 once a month for 6 months., Disp: 1 each, Rfl: 5 ?  simvastatin (ZOCOR) 40 MG tablet, TAKE 1 TABLET BY MOUTH ONCE DAILY IN THE MORNING WITH BREAKFAST, Disp: 90 tablet, Rfl: 0 ?  cyclobenzaprine (FLEXERIL) 5 MG tablet, Take 1 tablet (5 mg total) by mouth at bedtime as needed for muscle spasms. (Patient not taking: Reported on 01/07/2022), Disp: 30 tablet, Rfl: 0 ?  gabapentin (NEURONTIN) 300 MG capsule, Take 1 capsule by mouth (300 mg) at bedtime for 3 days. Increase to taking 1 capsule by mouth (300 mg) twice daily., Disp: 60 capsule, Rfl: 1 ?  meloxicam (MOBIC) 15 MG tablet, Take 1 tablet (15 mg total) by mouth daily. (Patient not taking: Reported on 01/07/2022), Disp: 10 tablet, Rfl: 0 ? ?Review of Systems:  ?Constitutional: Denies fever, chills, diaphoresis, appetite change and fatigue.  ?HEENT: Denies photophobia, eye pain, redness, hearing loss, mouth sores, trouble swallowing, neck pain, neck stiffness and tinnitus.   ?Respiratory: Denies SOB,  DOE, cough, chest tightness,  and wheezing.   ?Cardiovascular: Denies chest pain, palpitations. ?Gastrointestinal: Denies nausea, vomiting, abdominal pain, diarrhea, constipation, blood in stool and abdominal distention.  ?Genitourinary: Denies dysuria, urgency, frequency, hematuria, flank pain and difficulty urinating.  ?Endocrine: Denies: hot or cold intolerance, sweats, changes in hair or nails, polyuria, polydipsia. ?Musculoskeletal: Denies myalgias, back pain, joint swelling, arthralgias and gait problem.  ?Skin: Denies pallor, rash and wound.  ?Neurological: Denies dizziness, seizures, syncope, weakness, light-headedness, numbness and  headaches.  ?Hematological: Denies adenopathy. Easy bruising, personal or family bleeding history  ?Psychiatric/Behavioral: Denies suicidal ideation, mood changes, confusion, nervousness, sleep disturbance and agitation ? ? ? ?Physical Exam: ?Vitals:  ? 01/07/22 1423  ?BP: 120/84  ?Pulse: 87  ?Temp: 98.8 ?F (37.1 ?C)  ?TempSrc: Oral  ?SpO2: 97%  ?Weight: 262 lb 4.8 oz (119 kg)  ? ? ?Body mass index is 39.88 kg/m?. ? ? ?Constitutional: NAD, calm, comfortable ?Eyes: PERRL, lids and conjunctivae normal ?ENMT: Mucous membranes are moist.  ?Respiratory: clear to auscultation bilaterally, no wheezing, no crackles. Normal respiratory effort. No accessory muscle use.  ?Cardiovascular: Regular rate and rhythm, no murmurs / rubs / gallops.  2-3+ pitting edema of the right calf up to above the knee. ?Neurologic: Grossly intact and nonfocal ?Psychiatric: Normal judgment and insight. Alert and oriented x 3. Normal mood.  ? ? ?Impression and Plan: ? ?DM type 2, controlled, with complication ([place])  ?- Plan: POCT glycosylated hemoglobin (Hb A1C), Ambulatory referral to Ophthalmology, gabapentin (NEURONTIN) 300 MG capsule ?-In office A1c today demonstrates good control at 5.9 ? ?Screening for malignant neoplasm of colon ? - Plan: Ambulatory referral to Gastroenterology ? ?Viral URI with cough ?-Given exam findings, PNA, pharyngitis, ear infection are not likely, hence abx have not been prescribed. ?-Have advised rest, fluids, OTC antihistamines, cough suppressants and mucinex. ?-RTC if no improvement in 10-14 days. ? ?Edema of right lower leg  ?- Plan: VAS [place] LOWER EXTREMITY VENOUS (DVT) ? ?Acute pain of right knee ? - Plan: Ambulatory referral to Sports Medicine ? ?Time spent: 32 minutes reviewing chart, interviewing and examining patient and formulating plan of care. ? ? ? ? ?Lelon Frohlich, MD ?[facility] ? ? ?

## 2022-01-29 ENCOUNTER — Other Ambulatory Visit: Payer: Self-pay | Admitting: Internal Medicine

## 2022-01-29 DIAGNOSIS — I10 Essential (primary) hypertension: Secondary | ICD-10-CM

## 2022-03-04 ENCOUNTER — Other Ambulatory Visit: Payer: Self-pay | Admitting: Internal Medicine

## 2022-03-04 DIAGNOSIS — E785 Hyperlipidemia, unspecified: Secondary | ICD-10-CM

## 2022-03-04 DIAGNOSIS — I10 Essential (primary) hypertension: Secondary | ICD-10-CM

## 2022-03-23 ENCOUNTER — Other Ambulatory Visit: Payer: Self-pay | Admitting: Internal Medicine

## 2022-03-23 DIAGNOSIS — E118 Type 2 diabetes mellitus with unspecified complications: Secondary | ICD-10-CM

## 2022-04-09 ENCOUNTER — Other Ambulatory Visit: Payer: Self-pay | Admitting: *Deleted

## 2022-04-09 DIAGNOSIS — I10 Essential (primary) hypertension: Secondary | ICD-10-CM

## 2022-04-09 DIAGNOSIS — E118 Type 2 diabetes mellitus with unspecified complications: Secondary | ICD-10-CM

## 2022-04-09 DIAGNOSIS — E1169 Type 2 diabetes mellitus with other specified complication: Secondary | ICD-10-CM

## 2022-04-09 MED ORDER — SIMVASTATIN 40 MG PO TABS: ORAL_TABLET | ORAL | 0 refills | Status: DC

## 2022-04-09 MED ORDER — LISINOPRIL 40 MG PO TABS: 40.0000 mg | ORAL_TABLET | Freq: Every day | ORAL | 0 refills | Status: DC

## 2022-04-09 MED ORDER — AMLODIPINE BESYLATE 5 MG PO TABS: ORAL_TABLET | ORAL | 0 refills | Status: DC

## 2022-04-09 MED ORDER — METFORMIN HCL 500 MG PO TABS: 500.0000 mg | ORAL_TABLET | Freq: Every day | ORAL | 0 refills | Status: DC

## 2022-04-09 MED ORDER — HYDROCHLOROTHIAZIDE 25 MG PO TABS: 25.0000 mg | ORAL_TABLET | Freq: Every day | ORAL | 0 refills | Status: DC

## 2022-04-13 ENCOUNTER — Other Ambulatory Visit: Payer: Self-pay | Admitting: Internal Medicine

## 2022-04-13 DIAGNOSIS — I10 Essential (primary) hypertension: Secondary | ICD-10-CM

## 2022-04-16 ENCOUNTER — Encounter: Payer: Self-pay | Admitting: Internal Medicine

## 2022-04-16 LAB — HM DIABETES EYE EXAM

## 2022-05-01 ENCOUNTER — Other Ambulatory Visit: Payer: Self-pay | Admitting: Internal Medicine

## 2022-05-01 DIAGNOSIS — I10 Essential (primary) hypertension: Secondary | ICD-10-CM

## 2022-05-09 ENCOUNTER — Ambulatory Visit (HOSPITAL_COMMUNITY)
Admission: EM | Admit: 2022-05-09 | Discharge: 2022-05-09 | Disposition: A | Discharge status: Home / Self Care | Payer: Worker's Compensation | Attending: Physician Assistant | Admitting: Physician Assistant

## 2022-05-09 ENCOUNTER — Encounter (HOSPITAL_COMMUNITY): Payer: Self-pay | Admitting: Emergency Medicine

## 2022-05-09 DIAGNOSIS — S46011A Strain of muscle(s) and tendon(s) of the rotator cuff of right shoulder, initial encounter: Secondary | ICD-10-CM | POA: Diagnosis not present

## 2022-05-09 DIAGNOSIS — S8001XA Contusion of right knee, initial encounter: Secondary | ICD-10-CM

## 2022-05-09 DIAGNOSIS — M25561 Pain in right knee: Secondary | ICD-10-CM

## 2022-05-09 DIAGNOSIS — M25511 Pain in right shoulder: Secondary | ICD-10-CM

## 2022-05-09 MED ORDER — IBUPROFEN 600 MG PO TABS: 600.0000 mg | ORAL_TABLET | Freq: Three times a day (TID) | ORAL | 0 refills | Status: DC

## 2022-05-09 NOTE — Discharge Instructions (Signed)
Advised to take ibuprofen 600 mg 1 every 8 hours with food to help reduce the shoulder pain and the knee pain. Advised to use alternating ice and heat therapy, 10 minutes on 20 minutes off, 3-4 times a day to help reduce the pain and swelling. Advised to follow-up PCP or return to urgent care if symptoms fail to improve.

## 2022-05-09 NOTE — ED Triage Notes (Signed)
Right clavicle pain started this am. Also, states fell at work about 12 days ago with injury to right knee, hips and side of face, did not seek medical care.  Right knee still swollen. Can move right shoulder freely but hears popping.

## 2022-05-09 NOTE — ED Provider Notes (Signed)
[facility]    CSN: [id number] Arrival date & time: 05/09/22  1303      History   Chief Complaint Chief Complaint  Patient presents with   Shoulder Pain    HPI Jim Green is a 61 y.o. male.   60 year old male presents with right shoulder pain.  Patient indicates that about 10 days ago he fell while at work striking his right shoulder and his right knee.  Patient indicates since he has been having some mild right shoulder pain and tenderness and occasionally feels a pop in the shoulder joint and the medial clavicle area when he rotates his arm.  Patient relates he is not having any swelling at the site, no weakness to the shoulder, no numbness or tingling.  Patient indicates that the popping seems to be when he moves his arm forward or overhead.  Patient also relates that when he fell he struck his right knee on the floor, he had significant swelling at the time but this is improved with icing the area.  Patient relates he still gets right knee pain when he tends to go up and down steps.  Patient indicates he is not having any buckling of the knee, and no locking of the knee.   Shoulder Pain   Past Medical History:  Diagnosis Date   Allergy    Diabetes mellitus without complication (HCC)    Eczema    ED (erectile dysfunction)    Hyperlipidemia    Hypertension    Obesity    Sleep apnea     Patient Active Problem List   Diagnosis Date Noted   Vitamin D deficiency 08/17/2019   Vitamin B12 deficiency 08/17/2019   Viral URI with cough 06/27/2018   Mild intermittent asthma without complication 06/27/2018   Abdominal pain 09/29/2016   Osteoarthritis of both knees 03/30/2016   Benign paroxysmal positional vertigo 07/02/2015   Diabetes mellitus type II, uncontrolled (HCC) 01/31/2014   Back pain, chronic 08/29/2012   Atherosclerosis of aorta (HCC) 08/29/2012   DYSHIDROSIS 12/17/2009   Hyperlipidemia associated with type 2 diabetes mellitus (HCC) 10/09/2009    ERECTILE DYSFUNCTION 06/26/2008   UMBILICAL HERNIA 06/26/2008   Essential hypertension 06/24/2007    Past Surgical History:  Procedure Laterality Date   KNEE ARTHROSCOPY     right knee   NASAL SINUS SURGERY         Home Medications    Prior to Admission medications   Medication Sig Start Date End Date Taking? Authorizing Provider  ibuprofen (ADVIL) 600 MG tablet Take 1 tablet (600 mg total) by mouth 3 (three) times daily. 05/09/22  Yes Ellsworth Lennox, PA-C  amLODipine (NORVASC) 5 MG tablet TAKE 1 TABLET BY MOUTH ONCE DAILY . APPOINTMENT REQUIRED FOR FUTURE REFILLS 04/09/22   Philip Aspen, Limmie Patricia, MD  cyanocobalamin (,VITAMIN B-12,) 1000 MCG/ML injection Inject 1 mL (1,000 mcg total) into the muscle every 30 (thirty) days. 03/24/22   Philip Aspen, Limmie Patricia, MD  cyclobenzaprine (FLEXERIL) 5 MG tablet Take 1 tablet (5 mg total) by mouth at bedtime as needed for muscle spasms. Patient not taking: Reported on 01/07/2022 02/28/21   Philip Aspen, Limmie Patricia, MD  gabapentin (NEURONTIN) 300 MG capsule Take 1 capsule by mouth (300 mg) at bedtime for 3 days. Increase to taking 1 capsule by mouth (300 mg) twice daily. 01/07/22   Philip Aspen, Limmie Patricia, MD  hydrochlorothiazide (HYDRODIURIL) 25 MG tablet Take 1 tablet (25 mg total) by mouth daily. 04/09/22   Ardyth Harps  Priscella Mann, MD  lisinopril (ZESTRIL) 40 MG tablet Take 1 tablet (40 mg total) by mouth daily. 04/09/22   Philip Aspen, Limmie Patricia, MD  meloxicam (MOBIC) 15 MG tablet Take 1 tablet (15 mg total) by mouth daily. Patient not taking: Reported on 01/07/2022 03/05/21   Mardella Layman, MD  metFORMIN (GLUCOPHAGE) 500 MG tablet Take 1 tablet (500 mg total) by mouth daily with breakfast. 04/09/22   Philip Aspen, Limmie Patricia, MD  NEEDLE, DISP, 25 G (B-D DISP NEEDLE 25GX1") 25G X 1" MISC Inject 1,000 mcg of B12 once a month for 6 months. 09/22/19   Philip Aspen, Limmie Patricia, MD  simvastatin (ZOCOR) 40 MG tablet TAKE 1 TABLET BY MOUTH  ONCE DAILY IN THE MORNING WITH BREAKFAST . APPOINTMENT REQUIRED FOR FUTURE REFILLS 04/09/22   Philip Aspen, Limmie Patricia, MD    Family History Family History  Problem Relation Age of Onset   Heart disease Other    Hypertension Other    Obesity Other     Social History Social History   Tobacco Use   Smoking status: Never   Smokeless tobacco: Never  Substance Use Topics   Alcohol use: Yes    Alcohol/week: 12.0 standard drinks of alcohol    Types: 12 Standard drinks or equivalent per week   Drug use: No     Allergies   Patient has no known allergies.   Review of Systems Review of Systems  Musculoskeletal:  Positive for arthralgias (right shoulder pain) and joint swelling (right knee from fall).     Physical Exam Triage Vital Signs ED Triage Vitals  Enc Vitals Group     BP 05/09/22 1311 (!) 163/94     Pulse Rate 05/09/22 1311 82     Resp 05/09/22 1311 18     Temp 05/09/22 1311 98.7 F (37.1 C)     Temp Source 05/09/22 1311 Oral     SpO2 05/09/22 1311 97 %     Weight --      Height --      Head Circumference --      Peak Flow --      Pain Score 05/09/22 1313 3     Pain Loc --      Pain Edu? --      Excl. in GC? --    No data found.  Updated Vital Signs BP (!) 163/94 (BP Location: Left Arm)   Pulse 82   Temp 98.7 F (37.1 C) (Oral)   Resp 18   SpO2 97%   Visual Acuity Right Eye Distance:   Left Eye Distance:   Bilateral Distance:    Right Eye Near:   Left Eye Near:    Bilateral Near:     Physical Exam Constitutional:      Appearance: Normal appearance.  Musculoskeletal:     Comments: Right shoulder: Pain is palpated along the superior shoulder shoulder joint line around the [facility] area.  There is no redness or swelling present.  Full range of motion is normal with slight crepitus present with overhead motion.  Patient does have minimal pain with resistance lateral abduction.  Forward resistance abduction is normal.  There is mild pain on palpation of  the medial clavicle area, no swelling or redness Right knee: There is mild swelling present which is 1+ as compared to the left, range of motion is normal, stability is intact, negative drawers negative Lachman's.  Neurological:     Mental Status: He is alert.  UC Treatments / Results  Labs (all labs ordered are listed, but only abnormal results are displayed) Labs Reviewed - No data to display  EKG   Radiology No results found.  Procedures Procedures (including critical care time)  Medications Ordered in UC Medications - No data to display  Initial Impression / Assessment and Plan / UC Course  I have reviewed the triage vital signs and the nursing notes.  Pertinent labs & imaging results that were available during my care of the patient were reviewed by me and considered in my medical decision making (see chart for details).    Plan: 1.  Advised to take ibuprofen 600 mg 1 every 8 hours with food until completed. 2.  Advised to continue using ice therapy, 10 minutes on 20 minutes off, 3-4 times a day. 3.  Advised to follow-up PCP or return to urgent care if symptoms fail to improve.  (Consider right shoulder x-ray if symptoms fail to improve) Final Clinical Impressions(s) / UC Diagnoses   Final diagnoses:  Acute pain of right shoulder  Strain of rotator cuff of right shoulder  Acute pain of right knee  Contusion of right knee, initial encounter     Discharge Instructions      Advised to take ibuprofen 600 mg 1 every 8 hours with food to help reduce the shoulder pain and the knee pain. Advised to use alternating ice and heat therapy, 10 minutes on 20 minutes off, 3-4 times a day to help reduce the pain and swelling. Advised to follow-up PCP or return to urgent care if symptoms fail to improve.    ED Prescriptions     Medication Sig Dispense Auth. Provider   ibuprofen (ADVIL) 600 MG tablet Take 1 tablet (600 mg total) by mouth 3 (three) times daily. 30  tablet Ellsworth Lennox, PA-C      PDMP not reviewed this encounter.   Ellsworth Lennox, PA-C 05/09/22 1339

## 2022-07-30 ENCOUNTER — Other Ambulatory Visit: Payer: Self-pay | Admitting: Internal Medicine

## 2022-07-30 DIAGNOSIS — I10 Essential (primary) hypertension: Secondary | ICD-10-CM

## 2022-07-31 ENCOUNTER — Other Ambulatory Visit: Payer: Self-pay | Admitting: Internal Medicine

## 2022-07-31 DIAGNOSIS — E118 Type 2 diabetes mellitus with unspecified complications: Secondary | ICD-10-CM

## 2022-07-31 DIAGNOSIS — I10 Essential (primary) hypertension: Secondary | ICD-10-CM

## 2022-08-14 ENCOUNTER — Other Ambulatory Visit: Payer: Self-pay | Admitting: Internal Medicine

## 2022-08-14 DIAGNOSIS — I10 Essential (primary) hypertension: Secondary | ICD-10-CM

## 2022-08-16 ENCOUNTER — Other Ambulatory Visit: Payer: Self-pay | Admitting: Internal Medicine

## 2022-08-16 DIAGNOSIS — E1169 Type 2 diabetes mellitus with other specified complication: Secondary | ICD-10-CM

## 2022-09-04 ENCOUNTER — Ambulatory Visit (HOSPITAL_COMMUNITY)
Admission: EM | Admit: 2022-09-04 | Discharge: 2022-09-04 | Disposition: A | Discharge status: Home / Self Care | Payer: Commercial Managed Care - PPO | Attending: Family Medicine | Admitting: Family Medicine

## 2022-09-04 ENCOUNTER — Encounter (HOSPITAL_COMMUNITY): Payer: Self-pay

## 2022-09-04 DIAGNOSIS — M542 Cervicalgia: Secondary | ICD-10-CM

## 2022-09-04 MED ORDER — IBUPROFEN 600 MG PO TABS: 600.0000 mg | ORAL_TABLET | Freq: Three times a day (TID) | ORAL | 0 refills | Status: DC | PRN

## 2022-09-04 MED ORDER — TIZANIDINE HCL 4 MG PO TABS: 4.0000 mg | ORAL_TABLET | Freq: Three times a day (TID) | ORAL | 0 refills | Status: DC | PRN

## 2022-09-04 NOTE — ED Triage Notes (Signed)
Pt present to office for bilateral neck pain that started yesterday. Denies any injury to his neck.

## 2022-09-04 NOTE — ED Provider Notes (Signed)
[facility]    CSN: [id number] Arrival date & time: 09/04/22  1148      History   Chief Complaint Chief Complaint  Patient presents with   Neck Pain    HPI Alias Jim Green is a 61 y.o. male.    Neck Pain  Here for left-sided neck pain.  He first noted it yesterday when he woke up.  No associated fever or rash or URI symptoms.  He states he feels that his left neck around his ear is swollen.  It hurts to turn his head to the left. No trauma or fall.  He does have diabetes and states it is fairly well controlled.  Last creatinine was normal   Past Medical History:  Diagnosis Date   Allergy    Diabetes mellitus without complication (HCC)    Eczema    ED (erectile dysfunction)    Hyperlipidemia    Hypertension    Obesity    Sleep apnea     Patient Active Problem List   Diagnosis Date Noted   Vitamin D deficiency 08/17/2019   Vitamin B12 deficiency 08/17/2019   Viral URI with cough 06/27/2018   Mild intermittent asthma without complication 06/27/2018   Abdominal pain 09/29/2016   Osteoarthritis of both knees 03/30/2016   Benign paroxysmal positional vertigo 07/02/2015   Diabetes mellitus type II, uncontrolled (HCC) 01/31/2014   Back pain, chronic 08/29/2012   Atherosclerosis of aorta (HCC) 08/29/2012   DYSHIDROSIS 12/17/2009   Hyperlipidemia associated with type 2 diabetes mellitus (HCC) 10/09/2009   ERECTILE DYSFUNCTION 06/26/2008   UMBILICAL HERNIA 06/26/2008   Essential hypertension 06/24/2007    Past Surgical History:  Procedure Laterality Date   KNEE ARTHROSCOPY     right knee   NASAL SINUS SURGERY         Home Medications    Prior to Admission medications   Medication Sig Start Date End Date Taking? Authorizing Provider  ibuprofen (ADVIL) 600 MG tablet Take 1 tablet (600 mg total) by mouth every 8 (eight) hours as needed (pain). 09/04/22  Yes Kylyn Sookram, Janace Aris, MD  tiZANidine (ZANAFLEX) 4 MG tablet Take 1 tablet (4 mg total) by  mouth every 8 (eight) hours as needed for muscle spasms. 09/04/22  Yes Zenia Resides, MD  amLODipine (NORVASC) 5 MG tablet TAKE 1 TABLET ONCE DAILY (APPOINTMENT REQUIRED FOR FUTURE REFILLS/NEEDS A PHYSICAL) 08/03/22   Philip Aspen, Limmie Patricia, MD  cyanocobalamin (,VITAMIN B-12,) 1000 MCG/ML injection Inject 1 mL (1,000 mcg total) into the muscle every 30 (thirty) days. 03/24/22   Philip Aspen, Limmie Patricia, MD  gabapentin (NEURONTIN) 300 MG capsule Take 1 capsule by mouth (300 mg) at bedtime for 3 days. Increase to taking 1 capsule by mouth (300 mg) twice daily. 01/07/22   Philip Aspen, Limmie Patricia, MD  hydrochlorothiazide (HYDRODIURIL) 25 MG tablet TAKE 1 TABLET DAILY (PLEASE SCHEDULE A PHYSICAL FOR MORE REFILLS) 08/03/22   Philip Aspen, Limmie Patricia, MD  lisinopril (ZESTRIL) 40 MG tablet TAKE 1 TABLET BY MOUTH ONCE DAILY **PLEASE  SCHEDULE  A  PHYSICAL  FOR  MORE  REFILLS** 07/30/22   Philip Aspen, Limmie Patricia, MD  metFORMIN (GLUCOPHAGE) 500 MG tablet TAKE 1 TABLET DAILY WITH BREAKFAST (PLEASE SCHEDULE A PHYSICAL FOR MORE REFILLS) 08/03/22   Philip Aspen, Limmie Patricia, MD  NEEDLE, DISP, 25 G (B-D DISP NEEDLE 25GX1") 25G X 1" MISC Inject 1,000 mcg of B12 once a month for 6 months. 09/22/19   Philip Aspen, Limmie Patricia, MD  simvastatin (ZOCOR) 40 MG tablet TAKE 1 TABLET BY MOUTH ONCE DAILY IN THE MORNING WITH BREAKFAST . APPOINTMENT REQUIRED FOR FUTURE REFILLS 04/09/22   Philip Aspen, Limmie Patricia, MD    Family History Family History  Problem Relation Age of Onset   Heart disease Other    Hypertension Other    Obesity Other     Social History Social History   Tobacco Use   Smoking status: Never   Smokeless tobacco: Never  Substance Use Topics   Alcohol use: Yes    Alcohol/week: 12.0 standard drinks of alcohol    Types: 12 Standard drinks or equivalent per week   Drug use: No     Allergies   Patient has no known allergies.   Review of Systems Review of Systems   Musculoskeletal:  Positive for neck pain.     Physical Exam Triage Vital Signs ED Triage Vitals [09/04/22 1343]  Enc Vitals Group     BP (!) 147/85     Pulse Rate 78     Resp 18     Temp 98.5 F (36.9 C)     Temp Source Oral     SpO2 98 %     Weight      Height      Head Circumference      Peak Flow      Pain Score      Pain Loc      Pain Edu?      Excl. in GC?    No data found.  Updated Vital Signs BP (!) 147/85 (BP Location: Left Arm)   Pulse 78   Temp 98.5 F (36.9 C) (Oral)   Resp 18   SpO2 98%   Visual Acuity Right Eye Distance:   Left Eye Distance:   Bilateral Distance:    Right Eye Near:   Left Eye Near:    Bilateral Near:     Physical Exam Vitals reviewed.  Constitutional:      General: He is not in acute distress.    Appearance: He is not ill-appearing, toxic-appearing or diaphoretic.  HENT:     Mouth/Throat:     Mouth: Mucous membranes are moist.  Eyes:     Extraocular Movements: Extraocular movements intact.     Conjunctiva/sclera: Conjunctivae normal.     Pupils: Pupils are equal, round, and reactive to light.  Neck:     Comments: Bilaterally his parotids are mildly enlarged.  I do not palpate any mass inferior to the ear.  He is tender along his trapezius and sternocleidomastoid.  No rash or erythema or induration Skin:    Coloration: Skin is not jaundiced or pale.  Neurological:     General: No focal deficit present.     Mental Status: He is alert and oriented to person, place, and time.     Cranial Nerves: No cranial nerve deficit.     Sensory: No sensory deficit.     Motor: No weakness.     Coordination: Coordination normal.     Gait: Gait normal.     Deep Tendon Reflexes: Reflexes normal.      UC Treatments / Results  Labs (all labs ordered are listed, but only abnormal results are displayed) Labs Reviewed - No data to display  EKG   Radiology No results found.  Procedures Procedures (including critical care  time)  Medications Ordered in UC Medications - No data to display  Initial Impression / Assessment and Plan / UC Course  I have reviewed the triage vital signs and the nursing notes.  Pertinent labs & imaging results that were available during my care of the patient were reviewed by me and considered in my medical decision making (see chart for details).        The parotid gland enlargement is bilateral and equal between the 2 sides.  It seems to be more chronic in nature.  I do not palpate an abscess or adenopathy or other mass inferior to the left ear.  Therefore I want to treat him for muscle spasm and musculoskeletal pain. Final Clinical Impressions(s) / UC Diagnoses   Final diagnoses:  Neck pain     Discharge Instructions      Take ibuprofen 600 mg--1 tab every 8 hours as needed for pain.   Take tizanidine 4 mg--1 every 8 hours as needed for muscle spasms  Heating pad to the sore area on your neck can help      ED Prescriptions     Medication Sig Dispense Auth. Provider   ibuprofen (ADVIL) 600 MG tablet Take 1 tablet (600 mg total) by mouth every 8 (eight) hours as needed (pain). 21 tablet Jim Green, Janace Aris, MD   tiZANidine (ZANAFLEX) 4 MG tablet Take 1 tablet (4 mg total) by mouth every 8 (eight) hours as needed for muscle spasms. 21 tablet Jim Green, Janace Aris, MD      PDMP not reviewed this encounter.   Zenia Resides, MD 09/04/22 1409

## 2022-09-04 NOTE — Discharge Instructions (Signed)
Take ibuprofen 600 mg--1 tab every 8 hours as needed for pain.   Take tizanidine 4 mg--1 every 8 hours as needed for muscle spasms  Heating pad to the sore area on your neck can help

## 2022-09-25 ENCOUNTER — Other Ambulatory Visit: Payer: Self-pay | Admitting: Internal Medicine

## 2022-09-25 DIAGNOSIS — E785 Hyperlipidemia, unspecified: Secondary | ICD-10-CM

## 2022-10-15 ENCOUNTER — Encounter: Payer: Self-pay | Admitting: Adult Health

## 2022-10-15 ENCOUNTER — Telehealth (INDEPENDENT_AMBULATORY_CARE_PROVIDER_SITE_OTHER): Payer: Commercial Managed Care - PPO | Admitting: Adult Health

## 2022-10-15 VITALS — Ht 68.0 in | Wt 260.0 lb

## 2022-10-15 DIAGNOSIS — R051 Acute cough: Secondary | ICD-10-CM

## 2022-10-15 DIAGNOSIS — R0982 Postnasal drip: Secondary | ICD-10-CM

## 2022-10-15 MED ORDER — FLUTICASONE PROPIONATE 50 MCG/ACT NA SUSP: 2.0000 | Freq: Every day | NASAL | 6 refills | Status: DC

## 2022-10-15 MED ORDER — BENZONATATE 200 MG PO CAPS: 200.0000 mg | ORAL_CAPSULE | Freq: Three times a day (TID) | ORAL | 0 refills | Status: DC | PRN

## 2022-10-15 NOTE — Progress Notes (Signed)
Virtual Visit via Video Note  I connected with Jim Green  on 10/15/22 at 10:00 AM EST by a video enabled telemedicine application and verified that I am speaking with the correct person using two identifiers.  Location patient: home Location provider:work or home office Persons participating in the virtual visit: patient, provider  I discussed the limitations of evaluation and management by telemedicine and the availability of in person appointments. The patient expressed understanding and agreed to proceed.   HPI: 61 year old male who is being evaluated today for an acute issue.  He reports that he has had a cough for roughly 1 week.  Cough is more productive during the morning.  He also reports a feeling of chest congestion.  He denies fevers, chills, wheezing, or shortness of breath.  He does not feel acutely ill.  He also reports chronic sinus "issues" with postnasal drip.  At home he has been using various over-the-counter cough medications and taking Claritin without improvement.   ROS: See pertinent positives and negatives per HPI.  Past Medical History:  Diagnosis Date   Allergy    Diabetes mellitus without complication (HCC)    Eczema    ED (erectile dysfunction)    Hyperlipidemia    Hypertension    Obesity    Sleep apnea     Past Surgical History:  Procedure Laterality Date   KNEE ARTHROSCOPY     right knee   NASAL SINUS SURGERY      Family History  Problem Relation Age of Onset   Heart disease Other    Hypertension Other    Obesity Other        Current Outpatient Medications:    amLODipine (NORVASC) 5 MG tablet, TAKE 1 TABLET ONCE DAILY (APPOINTMENT REQUIRED FOR FUTURE REFILLS/NEEDS A PHYSICAL), Disp: 90 tablet, Rfl: 0   cyanocobalamin (,VITAMIN B-12,) 1000 MCG/ML injection, Inject 1 mL (1,000 mcg total) into the muscle every 30 (thirty) days., Disp: 6 mL, Rfl: 1   gabapentin (NEURONTIN) 300 MG capsule, Take 1 capsule by mouth (300 mg) at bedtime for 3  days. Increase to taking 1 capsule by mouth (300 mg) twice daily., Disp: 60 capsule, Rfl: 1   hydrochlorothiazide (HYDRODIURIL) 25 MG tablet, TAKE 1 TABLET DAILY (PLEASE SCHEDULE A PHYSICAL FOR MORE REFILLS), Disp: 90 tablet, Rfl: 0   ibuprofen (ADVIL) 600 MG tablet, Take 1 tablet (600 mg total) by mouth every 8 (eight) hours as needed (pain)., Disp: 21 tablet, Rfl: 0   lisinopril (ZESTRIL) 40 MG tablet, TAKE 1 TABLET BY MOUTH ONCE DAILY **PLEASE  SCHEDULE  A  PHYSICAL  FOR  MORE  REFILLS**, Disp: 90 tablet, Rfl: 0   metFORMIN (GLUCOPHAGE) 500 MG tablet, TAKE 1 TABLET DAILY WITH BREAKFAST (PLEASE SCHEDULE A PHYSICAL FOR MORE REFILLS), Disp: 90 tablet, Rfl: 0   NEEDLE, DISP, 25 G (B-D DISP NEEDLE 25GX1") 25G X 1" MISC, Inject 1,000 mcg of B12 once a month for 6 months., Disp: 1 each, Rfl: 5   simvastatin (ZOCOR) 40 MG tablet, TAKE 1 TABLET BY MOUTH IN THE MORNING WITH BREAKFAST . APPOINTMENT REQUIRED FOR FUTURE REFILLS, Disp: 90 tablet, Rfl: 0   tiZANidine (ZANAFLEX) 4 MG tablet, Take 1 tablet (4 mg total) by mouth every 8 (eight) hours as needed for muscle spasms., Disp: 21 tablet, Rfl: 0  EXAM:  VITALS per patient if applicable:  GENERAL: alert, oriented, appears well and in no acute distress  HEENT: atraumatic, conjunttiva clear, no obvious abnormalities on inspection of external nose and  ears  NECK: normal movements of the head and neck  LUNGS: on inspection no signs of respiratory distress, breathing rate appears normal, no obvious gross SOB, gasping or wheezing  CV: no obvious cyanosis  MS: moves all visible extremities without noticeable abnormality  PSYCH/NEURO: pleasant and cooperative, no obvious depression or anxiety, speech and thought processing grossly intact  ASSESSMENT AND PLAN:  Discussed the following assessment and plan: 1. Acute cough -Production likely from postnasal drip.  No concern for pneumonia at this time.  Will trial  him on Tessalon Perles.  Follow-up in 4  to 5 days if no improvement - benzonatate (TESSALON) 200 MG capsule; Take 1 capsule (200 mg total) by mouth 3 (three) times daily as needed for cough.  Dispense: 30 capsule; Refill: 0  2. Post-nasal drip  - fluticasone (FLONASE) 50 MCG/ACT nasal spray; Place 2 sprays into both nostrils daily.  Dispense: 16 g; Refill: 6      I discussed the assessment and treatment plan with the patient. The patient was provided an opportunity to ask questions and all were answered. The patient agreed with the plan and demonstrated an understanding of the instructions.   The patient was advised to call back or seek an in-person evaluation if the symptoms worsen or if the condition fails to improve as anticipated.   Shirline Frees, NP

## 2022-10-24 ENCOUNTER — Other Ambulatory Visit: Payer: Self-pay | Admitting: Internal Medicine

## 2022-10-24 DIAGNOSIS — E118 Type 2 diabetes mellitus with unspecified complications: Secondary | ICD-10-CM

## 2022-11-05 ENCOUNTER — Other Ambulatory Visit: Payer: Self-pay | Admitting: Internal Medicine

## 2022-11-05 DIAGNOSIS — I10 Essential (primary) hypertension: Secondary | ICD-10-CM

## 2022-11-19 ENCOUNTER — Other Ambulatory Visit: Payer: Self-pay | Admitting: Internal Medicine

## 2022-11-19 DIAGNOSIS — I10 Essential (primary) hypertension: Secondary | ICD-10-CM

## 2022-11-25 ENCOUNTER — Encounter: Payer: Self-pay | Admitting: Podiatry

## 2022-11-25 ENCOUNTER — Other Ambulatory Visit: Payer: Self-pay | Admitting: Podiatry

## 2022-11-25 ENCOUNTER — Ambulatory Visit (INDEPENDENT_AMBULATORY_CARE_PROVIDER_SITE_OTHER): Payer: Commercial Managed Care - PPO

## 2022-11-25 ENCOUNTER — Ambulatory Visit (INDEPENDENT_AMBULATORY_CARE_PROVIDER_SITE_OTHER): Payer: Commercial Managed Care - PPO | Admitting: Podiatry

## 2022-11-25 DIAGNOSIS — M7752 Other enthesopathy of left foot: Secondary | ICD-10-CM

## 2022-11-25 DIAGNOSIS — M109 Gout, unspecified: Secondary | ICD-10-CM | POA: Diagnosis not present

## 2022-11-25 DIAGNOSIS — R609 Edema, unspecified: Secondary | ICD-10-CM

## 2022-11-25 MED ORDER — TRIAMCINOLONE ACETONIDE 10 MG/ML IJ SUSP
10.0000 mg | Freq: Once | INTRAMUSCULAR | Status: AC
  Administered 2022-11-25: 10 mg

## 2022-11-25 MED ORDER — PREDNISONE 10 MG PO TABS: ORAL_TABLET | ORAL | 0 refills | Status: DC

## 2022-11-25 NOTE — Progress Notes (Signed)
rh

## 2022-11-25 NOTE — Patient Instructions (Signed)
Gout  Gout is painful swelling of your joints. Gout is a type of arthritis. It is caused by having too much uric acid in your body. Uric acid is a chemical that is made when your body breaks down substances called purines. If your body has too much uric acid, sharp crystals can form and build up in your joints. This causes pain and swelling. Gout attacks can happen quickly and be very painful (acute gout). Over time, the attacks can affect more joints and happen more often (chronic gout). What are the causes? Gout is caused by too much uric acid in your blood. This can happen because: Your kidneys do not remove enough uric acid from your blood. Your body makes too much uric acid. You eat too many foods that are high in purines. These foods include organ meats, some seafood, and beer. Trauma or stress can bring on an attack. What increases the risk? Having a family history of gout. Being male and middle-aged. Being male and having gone through menopause. Having an organ transplant. Taking certain medicines. Having certain conditions, such as: Being very overweight (obese). Lead poisoning. Kidney disease. A skin condition called psoriasis. Other risks include: Losing weight too quickly. Not having enough water in the body (being dehydrated). Drinking alcohol, especially beer. Drinking beverages that are sweetened with a type of sugar called fructose. What are the signs or symptoms? An attack of acute gout often starts at night and usually happens in just one joint. The most common place is the big toe. Other joints that may be affected include joints of the feet, ankle, knee, fingers, wrist, or elbow. Symptoms may include: Very bad pain. Warmth. Swelling. Stiffness. Tenderness. The affected joint may be very painful to touch. Shiny, red, or purple skin. Chills and fever. Chronic gout may cause symptoms more often. More joints may be involved. You may also have white or yellow lumps  (tophi) on your hands or feet or in other areas near your joints. How is this treated? Treatment for an acute attack may include medicines for pain and swelling, such as: NSAIDs, such as ibuprofen. Steroids taken by mouth or injected into a joint. Colchicine. This can be given by mouth or through an IV tube. Treatment to prevent future attacks may include: Taking small doses of NSAIDs or colchicine daily. Using a medicine that reduces uric acid levels in your blood, such as allopurinol. Making changes to your diet. You may need to see a food expert (dietitian) about what to eat and drink to prevent gout. Follow these instructions at home: During a gout attack  If told, put ice on the painful area. To do this: Put ice in a plastic bag. Place a towel between your skin and the bag. Leave the ice on for 20 minutes, 2-3 times a day. Take off the ice if your skin turns bright red. This is very important. If you cannot feel pain, heat, or cold, you have a greater risk of damage to the area. Raise the painful joint above the level of your heart as often as you can. Rest the joint as much as possible. If the joint is in your leg, you may be given crutches. Follow instructions from your doctor about what you cannot eat or drink. Avoiding future gout attacks Eat a low-purine diet. Avoid foods and drinks such as: Liver. Kidney. Anchovies. Asparagus. Herring. Mushrooms. Mussels. Beer. Stay at a healthy weight. If you want to lose weight, talk with your doctor. Do not  lose weight too fast. Start or continue an exercise plan as told by your doctor. Eating and drinking Avoid drinks sweetened by fructose. Drink enough fluids to keep your pee (urine) pale yellow. If you drink alcohol: Limit how much you have to: 0-1 drink a day for women who are not pregnant. 0-2 drinks a day for men. Know how much alcohol is in a drink. In the [place] one drink equals one 12 oz bottle of beer (355 mL), one 5 oz  glass of wine (148 mL), or one 1 oz glass of hard liquor (44 mL). General instructions Take over-the-counter and prescription medicines only as told by your doctor. Ask your doctor if you should avoid driving or using machines while you are taking your medicine. Return to your normal activities when your doctor says that it is safe. Keep all follow-up visits. Where to find more information [employer] [facility] [employer]: www.niams.SouthEx[employer].es Contact a doctor if: You have another gout attack. You still have symptoms of a gout attack after 10 days of treatment. You have problems (side effects) because of your medicines. You have chills or a fever. You have burning pain when you pee (urinate). You have pain in your lower back or belly. Get help right away if: You have very bad pain. Your pain cannot be controlled. You cannot pee. Summary Gout is painful swelling of the joints. The most common site of pain is the big toe, but it can affect other joints. Medicines and avoiding some foods can help to prevent and treat gout attacks. This information is not intended to replace advice given to you by your health care provider. Make sure you discuss any questions you have with your health care provider. Document Revised: 07/09/2021 Document Reviewed: 07/09/2021 Elsevier Patient Education  [place].

## 2022-11-25 NOTE — Progress Notes (Signed)
Subjective:   Patient ID: Jim Green, male   DOB: 62 y.o.   MRN: [id number]   HPI Patient presents with a lot of inflammation and pain in the big toe joint left with fluid buildup and also has some discomfort across the top of the foot and has had apparent issues with this several times over the last year and this is just been the worst event   ROS      Objective:  Physical Exam  Neurovascular status intact with inflammation dorsal left foot inflammation fluid around the first MPJ bunion deformity left and inflammatory consistent with probable gout left     Assessment:  Inflammatory capsulitis first MPJ left into the dorsal foot with the probability of gout as precipitating issue with this patient     Plan:  H&P gout discussed and I am sending for blood work to see the numbers and may have to place him on medication.  Reviewed x-ray discussed bunion correction but would hold off currently certainly until we get everything under control and I went ahead today I did do sterile prep and I injected around the first MPJ 3 mg Kenalog 5 mg Xylocaine and then went ahead and placed him on a Sterapred DS Dosepak.  Reappoint 1 week  X-rays indicate significant structural bunion deformity left elevation of the intermetatarsal angle moderate arthritis

## 2022-11-26 ENCOUNTER — Other Ambulatory Visit: Payer: Self-pay | Admitting: Internal Medicine

## 2022-11-26 DIAGNOSIS — I10 Essential (primary) hypertension: Secondary | ICD-10-CM

## 2022-11-26 LAB — COMPREHENSIVE METABOLIC PANEL
AG Ratio: 1.2 (calc) (ref 1.0–2.5)
ALT: 17 U/L (ref 9–46)
AST: 17 U/L (ref 10–35)
Albumin: 4.1 g/dL (ref 3.6–5.1)
Alkaline phosphatase (APISO): 74 U/L (ref 35–144)
BUN: 18 mg/dL (ref 7–25)
CO2: 29 mmol/L (ref 20–32)
Calcium: 9.7 mg/dL (ref 8.6–10.3)
Chloride: 100 mmol/L (ref 98–110)
Creat: 0.86 mg/dL (ref 0.70–1.35)
Globulin: 3.4 g/dL (calc) (ref 1.9–3.7)
Glucose, Bld: 97 mg/dL (ref 65–139)
Potassium: 3.6 mmol/L (ref 3.5–5.3)
Sodium: 139 mmol/L (ref 135–146)
Total Bilirubin: 0.6 mg/dL (ref 0.2–1.2)
Total Protein: 7.5 g/dL (ref 6.1–8.1)

## 2022-11-26 LAB — CBC WITH DIFFERENTIAL/PLATELET
Absolute Monocytes: 515 cells/uL (ref 200–950)
Basophils Absolute: 41 cells/uL (ref 0–200)
Basophils Relative: 0.9 %
Eosinophils Absolute: 18 cells/uL (ref 15–500)
Eosinophils Relative: 0.4 %
HCT: 41.5 % (ref 38.5–50.0)
Hemoglobin: 14.1 g/dL (ref 13.2–17.1)
Lymphs Abs: 1523 cells/uL (ref 850–3900)
MCH: 29.7 pg (ref 27.0–33.0)
MCHC: 34 g/dL (ref 32.0–36.0)
MCV: 87.6 fL (ref 80.0–100.0)
MPV: 10.8 fL (ref 7.5–12.5)
Monocytes Relative: 11.2 %
Neutro Abs: 2502 cells/uL (ref 1500–7800)
Neutrophils Relative %: 54.4 %
Platelets: 238 10*3/uL (ref 140–400)
RBC: 4.74 10*6/uL (ref 4.20–5.80)
RDW: 13.2 % (ref 11.0–15.0)
Total Lymphocyte: 33.1 %
WBC: 4.6 10*3/uL (ref 3.8–10.8)

## 2022-11-26 LAB — URIC ACID: Uric Acid, Serum: 8.2 mg/dL — ABNORMAL HIGH (ref 4.0–8.0)

## 2022-11-26 LAB — C-REACTIVE PROTEIN: CRP: 9.8 mg/L — ABNORMAL HIGH (ref <8.0)

## 2022-11-26 LAB — RHEUMATOID FACTOR: Rheumatoid fact SerPl-aCnc: <14 IU/mL (ref <14)

## 2022-11-26 LAB — SEDIMENTATION RATE: Sed Rate: 17 mm/h (ref 0–20)

## 2022-12-02 ENCOUNTER — Ambulatory Visit (INDEPENDENT_AMBULATORY_CARE_PROVIDER_SITE_OTHER): Payer: Commercial Managed Care - PPO | Admitting: Podiatry

## 2022-12-02 ENCOUNTER — Encounter: Payer: Self-pay | Admitting: Podiatry

## 2022-12-02 DIAGNOSIS — M7752 Other enthesopathy of left foot: Secondary | ICD-10-CM | POA: Diagnosis not present

## 2022-12-02 DIAGNOSIS — M109 Gout, unspecified: Secondary | ICD-10-CM | POA: Diagnosis not present

## 2022-12-02 DIAGNOSIS — M21619 Bunion of unspecified foot: Secondary | ICD-10-CM

## 2022-12-02 NOTE — Progress Notes (Signed)
Patient states feels a lot subjective:   Patient ID: Jim Green, male   DOB: 62 y.o.   MRN: [id number]   HPI Better but still getting pain with the bunion left it can be bothersome for him.  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  We discussed the consideration of gout medicine organ to hold off and he is can to try to modify diet but he understands it may be necessary and also structural bunion correction may be necessary at 1 point future.  We discussed all the pros and cons and we will see how he does and that we will guide what we can need to do with this

## 2023-01-25 ENCOUNTER — Other Ambulatory Visit: Payer: Self-pay | Admitting: Internal Medicine

## 2023-01-25 DIAGNOSIS — E118 Type 2 diabetes mellitus with unspecified complications: Secondary | ICD-10-CM

## 2023-01-31 ENCOUNTER — Other Ambulatory Visit: Payer: Self-pay | Admitting: Internal Medicine

## 2023-01-31 DIAGNOSIS — E1169 Type 2 diabetes mellitus with other specified complication: Secondary | ICD-10-CM

## 2023-02-03 ENCOUNTER — Other Ambulatory Visit: Payer: Self-pay | Admitting: Internal Medicine

## 2023-02-03 DIAGNOSIS — I10 Essential (primary) hypertension: Secondary | ICD-10-CM

## 2023-02-08 ENCOUNTER — Other Ambulatory Visit: Payer: Self-pay | Admitting: Internal Medicine

## 2023-02-08 DIAGNOSIS — I10 Essential (primary) hypertension: Secondary | ICD-10-CM

## 2023-02-28 ENCOUNTER — Other Ambulatory Visit: Payer: Self-pay | Admitting: Internal Medicine

## 2023-02-28 DIAGNOSIS — E1169 Type 2 diabetes mellitus with other specified complication: Secondary | ICD-10-CM

## 2023-03-08 ENCOUNTER — Other Ambulatory Visit: Payer: Self-pay | Admitting: Internal Medicine

## 2023-03-08 DIAGNOSIS — I10 Essential (primary) hypertension: Secondary | ICD-10-CM

## 2023-03-08 DIAGNOSIS — E1169 Type 2 diabetes mellitus with other specified complication: Secondary | ICD-10-CM

## 2023-03-09 ENCOUNTER — Telehealth: Payer: Self-pay | Admitting: Internal Medicine

## 2023-03-09 DIAGNOSIS — I10 Essential (primary) hypertension: Secondary | ICD-10-CM

## 2023-03-09 DIAGNOSIS — E1169 Type 2 diabetes mellitus with other specified complication: Secondary | ICD-10-CM

## 2023-03-09 MED ORDER — SIMVASTATIN 40 MG PO TABS
ORAL_TABLET | ORAL | 0 refills | Status: DC
Start: 2023-03-09 — End: 2023-04-12

## 2023-03-09 MED ORDER — LISINOPRIL 40 MG PO TABS
ORAL_TABLET | ORAL | 0 refills | Status: DC
Start: 2023-03-09 — End: 2023-04-12

## 2023-03-09 NOTE — Telephone Encounter (Signed)
Prescription Request  03/09/2023  LOV: Visit date not found  What is the name of the medication or equipment? lisinopril (ZESTRIL) 40 MG tablet , simvastatin (ZOCOR) 40 MG tablet . Pt has cpe sch for 05-17-2023 also mychart appt on 03-11-2023  Have you contacted your pharmacy to request a refill? No   Which pharmacy would you like this sent to?  [id number] - [place], [place] - [address] [place] [address] Phone: [phone] Fax: [phone]  Patient notified that their request is being sent to the clinical staff for review and that they should receive a response within 2 business days.   Please advise at Mobile [phone] (mobile)

## 2023-03-09 NOTE — Telephone Encounter (Signed)
Refills sent

## 2023-03-11 ENCOUNTER — Telehealth: Payer: Commercial Managed Care - PPO | Admitting: Internal Medicine

## 2023-03-11 ENCOUNTER — Encounter: Payer: Self-pay | Admitting: Internal Medicine

## 2023-03-11 VITALS — Wt 275.0 lb

## 2023-03-11 DIAGNOSIS — M79671 Pain in right foot: Secondary | ICD-10-CM

## 2023-03-11 NOTE — Progress Notes (Signed)
Advised to schedule in  person appointment to discuss this concern. No charge visit.  Peggye Pitt, MD [facility]

## 2023-03-16 ENCOUNTER — Ambulatory Visit (INDEPENDENT_AMBULATORY_CARE_PROVIDER_SITE_OTHER): Payer: Commercial Managed Care - PPO | Admitting: Internal Medicine

## 2023-03-16 ENCOUNTER — Encounter: Payer: Self-pay | Admitting: Internal Medicine

## 2023-03-16 VITALS — BP 111/73 | HR 81 | Temp 98.2°F | Wt 257.7 lb

## 2023-03-16 DIAGNOSIS — E559 Vitamin D deficiency, unspecified: Secondary | ICD-10-CM | POA: Diagnosis not present

## 2023-03-16 DIAGNOSIS — E1169 Type 2 diabetes mellitus with other specified complication: Secondary | ICD-10-CM

## 2023-03-16 DIAGNOSIS — E118 Type 2 diabetes mellitus with unspecified complications: Secondary | ICD-10-CM

## 2023-03-16 DIAGNOSIS — I700 Atherosclerosis of aorta: Secondary | ICD-10-CM

## 2023-03-16 DIAGNOSIS — E538 Deficiency of other specified B group vitamins: Secondary | ICD-10-CM

## 2023-03-16 DIAGNOSIS — Z7984 Long term (current) use of oral hypoglycemic drugs: Secondary | ICD-10-CM

## 2023-03-16 DIAGNOSIS — B351 Tinea unguium: Secondary | ICD-10-CM

## 2023-03-16 DIAGNOSIS — Z1211 Encounter for screening for malignant neoplasm of colon: Secondary | ICD-10-CM

## 2023-03-16 DIAGNOSIS — I10 Essential (primary) hypertension: Secondary | ICD-10-CM | POA: Diagnosis not present

## 2023-03-16 DIAGNOSIS — E785 Hyperlipidemia, unspecified: Secondary | ICD-10-CM

## 2023-03-16 LAB — POCT GLYCOSYLATED HEMOGLOBIN (HGB A1C): Hemoglobin A1C: 5.9 % — AB (ref 4.0–5.6)

## 2023-03-16 MED ORDER — CYANOCOBALAMIN 1000 MCG/ML IJ SOLN: 1000.0000 ug | INTRAMUSCULAR | 1 refills | Status: DC

## 2023-03-16 MED ORDER — BD DISP NEEDLE 25G X 1" MISC
5 refills | Status: AC
Start: 2023-03-16 — End: ?

## 2023-03-16 MED ORDER — AMLODIPINE BESYLATE 5 MG PO TABS: 5.0000 mg | ORAL_TABLET | Freq: Every day | ORAL | 1 refills | Status: DC

## 2023-03-16 NOTE — Progress Notes (Signed)
Established Patient Office Visit     CC/Reason for Visit: Follow up chronic conditions, discuss toenails  HPI: Jim Green is a 62 y.o. male who is coming in today for the above mentioned reasons. Past Medical History is significant for: HTN, HLD, DM2, obesity, Vitamin D and B12 deficiencies. He has some concerns about his toenails: thick and yellow, hard to cut with toenail trimmers. Has been having issues with his bunions considering having surgery. Overdue for labs and CPE. Non-fasting today.   Past Medical/Surgical History: Past Medical History:  Diagnosis Date   Allergy    Diabetes mellitus without complication (HCC)    Eczema    ED (erectile dysfunction)    Hyperlipidemia    Hypertension    Obesity    Sleep apnea     Past Surgical History:  Procedure Laterality Date   KNEE ARTHROSCOPY     right knee   NASAL SINUS SURGERY      Social History:  reports that he has never smoked. He has never used smokeless tobacco. He reports current alcohol use of about 12.0 standard drinks of alcohol per week. He reports that he does not use drugs.  Allergies: No Known Allergies  Family History:  Family History  Problem Relation Age of Onset   Heart disease Other    Hypertension Other    Obesity Other      Current Outpatient Medications:    hydrochlorothiazide (HYDRODIURIL) 25 MG tablet, TAKE 1 TABLET DAILY (PLEASE SCHEDULE A PHYSICAL FOR MORE REFILLS), Disp: 90 tablet, Rfl: 0   lisinopril (ZESTRIL) 40 MG tablet, TAKE 1 TABLET BY MOUTH ONCE DAILY. PLEASE SCHEDULE A PHYSICAL FOR MORE REFILLS, Disp: 30 tablet, Rfl: 0   metFORMIN (GLUCOPHAGE) 500 MG tablet, TAKE 1 TABLET DAILY WITH BREAKFAST (PLEASE SCHEDULE A PHYSICAL FOR MORE REFILLS), Disp: 90 tablet, Rfl: 0   simvastatin (ZOCOR) 40 MG tablet, TAKE 1 TABLET BY MOUTH ONCE DAILY WITH BREAKFAST. APPOINTMENT REQUIRED FOR FUTURE REFILLS, Disp: 30 tablet, Rfl: 0   amLODipine (NORVASC) 5 MG tablet, Take 1 tablet (5 mg  total) by mouth daily., Disp: 90 tablet, Rfl: 1   cyanocobalamin (VITAMIN B12) 1000 MCG/ML injection, Inject 1 mL (1,000 mcg total) into the muscle every 30 (thirty) days., Disp: 6 mL, Rfl: 1   fluticasone (FLONASE) 50 MCG/ACT nasal spray, Place 2 sprays into both nostrils daily. (Patient not taking: Reported on 03/16/2023), Disp: 16 g, Rfl: 6   gabapentin (NEURONTIN) 300 MG capsule, Take 1 capsule by mouth (300 mg) at bedtime for 3 days. Increase to taking 1 capsule by mouth (300 mg) twice daily. (Patient not taking: Reported on 03/16/2023), Disp: 60 capsule, Rfl: 1   ibuprofen (ADVIL) 600 MG tablet, Take 1 tablet (600 mg total) by mouth every 8 (eight) hours as needed (pain). (Patient not taking: Reported on 03/16/2023), Disp: 21 tablet, Rfl: 0   NEEDLE, DISP, 25 G (B-D DISP NEEDLE 25GX1") 25G X 1" MISC, Inject 1,000 mcg of B12 once a month for 6 months., Disp: 100 each, Rfl: 5   tiZANidine (ZANAFLEX) 4 MG tablet, Take 1 tablet (4 mg total) by mouth every 8 (eight) hours as needed for muscle spasms. (Patient not taking: Reported on 03/16/2023), Disp: 21 tablet, Rfl: 0  Review of Systems:  Negative unless indicated in HPI.   Physical Exam: Vitals:   03/16/23 1000  BP: 111/73  Pulse: 81  Temp: 98.2 F (36.8 C)  TempSrc: Oral  SpO2: 99%  Weight: 257 lb 11.2  oz (116.9 kg)    Body mass index is 39.18 kg/m.   Physical Exam Vitals reviewed.  Constitutional:      Appearance: Normal appearance.  HENT:     Head: Normocephalic and atraumatic.  Eyes:     Conjunctiva/sclera: Conjunctivae normal.     Pupils: Pupils are equal, round, and reactive to light.  Cardiovascular:     Rate and Rhythm: Normal rate and regular rhythm.  Pulmonary:     Effort: Pulmonary effort is normal.     Breath sounds: Normal breath sounds.  Musculoskeletal:     Right foot: Bunion present.     Left foot: Bunion present.  Feet:     Right foot:     Toenail Condition: Fungal disease present.    Left foot:      Toenail Condition: Fungal disease present. Skin:    General: Skin is warm and dry.  Neurological:     General: No focal deficit present.     Mental Status: He is alert and oriented to person, place, and time.  Psychiatric:        Mood and Affect: Mood normal.        Behavior: Behavior normal.        Thought Content: Thought content normal.        Judgment: Judgment normal.      Impression and Plan:  Essential hypertension Assessment & Plan: Well controlled on lisinopril 40, amlodipine 5, HCTZ 25 mg daily.  Orders: -     amLODIPine Besylate; Take 1 tablet (5 mg total) by mouth daily.  Dispense: 90 tablet; Refill: 1  Screening for malignant neoplasm of colon -     Ambulatory referral to Gastroenterology  Vitamin D deficiency Assessment & Plan: Check levels when he returns for CPE.   Vitamin B12 deficiency Assessment & Plan: B12 refilled. Check levels when he returns for CPE.  Orders: -     Cyanocobalamin; Inject 1 mL (1,000 mcg total) into the muscle every 30 (thirty) days.  Dispense: 6 mL; Refill: 1 -     BD Disp Needle; Inject 1,000 mcg of B12 once a month for 6 months.  Dispense: 100 each; Refill: 5  Hyperlipidemia associated with type 2 diabetes mellitus (HCC) Assessment & Plan: On simvastatin 40 mg daily. Check lipids when he returns for CPE. Goal LDL <70.   Onychomycosis Assessment & Plan: Long, thickened, yellow toenails. Will refer to podiatry for at risk foot care and nail trimming. Don't think he would be a good candidate for oral terbinafine.  Orders: -     Ambulatory referral to Podiatry  Type 2 diabetes mellitus with other specified complication, without long-term current use of insulin (HCC) Assessment & Plan: Well controlled with an A1c of 5.9 today. On metformin.  Orders: -     POCT glycosylated hemoglobin (Hb A1C) -     Microalbumin / creatinine urine ratio; Future  Atherosclerosis of aorta [facility]) Assessment & Plan: Noted, on  statin.      Time spent:32 minutes reviewing chart, interviewing and examining patient and formulating plan of care.     Chaya Jan, MD [facility]

## 2023-03-16 NOTE — Assessment & Plan Note (Signed)
Long, thickened, yellow toenails. Will refer to podiatry for at risk foot care and nail trimming. Don't think he would be a good candidate for oral terbinafine.

## 2023-03-16 NOTE — Assessment & Plan Note (Signed)
Check levels when he returns for CPE.

## 2023-03-16 NOTE — Assessment & Plan Note (Signed)
Well controlled with an A1c of 5.9 today. On metformin.

## 2023-03-16 NOTE — Assessment & Plan Note (Signed)
Well controlled on lisinopril 40, amlodipine 5, HCTZ 25 mg daily.

## 2023-03-16 NOTE — Assessment & Plan Note (Signed)
On simvastatin 40 mg daily. Check lipids when he returns for CPE. Goal LDL <70.

## 2023-03-16 NOTE — Assessment & Plan Note (Signed)
B12 refilled. Check levels when he returns for CPE.

## 2023-03-16 NOTE — Assessment & Plan Note (Signed)
Noted, on statin.

## 2023-03-21 ENCOUNTER — Encounter (HOSPITAL_COMMUNITY): Payer: Self-pay | Admitting: Emergency Medicine

## 2023-03-21 ENCOUNTER — Ambulatory Visit (HOSPITAL_COMMUNITY)
Admission: EM | Admit: 2023-03-21 | Discharge: 2023-03-21 | Disposition: A | Discharge status: Home / Self Care | Payer: Commercial Managed Care - PPO | Attending: Internal Medicine | Admitting: Internal Medicine

## 2023-03-21 ENCOUNTER — Other Ambulatory Visit: Payer: Self-pay

## 2023-03-21 DIAGNOSIS — N390 Urinary tract infection, site not specified: Secondary | ICD-10-CM | POA: Diagnosis present

## 2023-03-21 LAB — POCT URINALYSIS DIP (MANUAL ENTRY)
Glucose, UA: NEGATIVE mg/dL
Ketones, POC UA: NEGATIVE mg/dL
Nitrite, UA: NEGATIVE
Protein Ur, POC: 30 mg/dL — AB
Spec Grav, UA: 1.02 (ref 1.010–1.025)
Urobilinogen, UA: 1 E.U./dL
pH, UA: 5 — AB (ref 5.0–8.0)

## 2023-03-21 MED ORDER — CIPROFLOXACIN HCL 500 MG PO TABS: 500.0000 mg | ORAL_TABLET | Freq: Two times a day (BID) | ORAL | 0 refills | Status: AC

## 2023-03-21 NOTE — ED Provider Notes (Signed)
[facility]    CSN: [id number] Arrival date & time: 03/21/23  1007     History   Chief Complaint Chief Complaint  Patient presents with   Dysuria    HPI Jim Green is a 62 y.o. male.  Here with 3 day history of dysuria and urgency Intermittent urinary hesitancy  No hematuria, flank pain, fever or chills Denies abd or groin pain. Had a BM yesterday that was a little hard  Has tried ibuprofen   History of UTI similar to this  Past Medical History:  Diagnosis Date   Allergy    Diabetes mellitus without complication (HCC)    Eczema    ED (erectile dysfunction)    Hyperlipidemia    Hypertension    Obesity    Sleep apnea     Patient Active Problem List   Diagnosis Date Noted   Onychomycosis 03/16/2023   Vitamin D deficiency 08/17/2019   Vitamin B12 deficiency 08/17/2019   Viral URI with cough 06/27/2018   Mild intermittent asthma without complication 06/27/2018   Abdominal pain 09/29/2016   Osteoarthritis of both knees 03/30/2016   Benign paroxysmal positional vertigo 07/02/2015   DM (diabetes mellitus), type 2 (HCC) 01/31/2014   Back pain, chronic 08/29/2012   Atherosclerosis of aorta (HCC) 08/29/2012   DYSHIDROSIS 12/17/2009   Hyperlipidemia associated with type 2 diabetes mellitus (HCC) 10/09/2009   ERECTILE DYSFUNCTION 06/26/2008   UMBILICAL HERNIA 06/26/2008   Essential hypertension 06/24/2007    Past Surgical History:  Procedure Laterality Date   KNEE ARTHROSCOPY     right knee   NASAL SINUS SURGERY         Home Medications    Prior to Admission medications   Medication Sig Start Date End Date Taking? Authorizing Provider  ciprofloxacin (CIPRO) 500 MG tablet Take 1 tablet (500 mg total) by mouth every 12 (twelve) hours for 7 days. 03/21/23 03/28/23 Yes Yoshika Vensel, Lurena Joiner, PA-C  amLODipine (NORVASC) 5 MG tablet Take 1 tablet (5 mg total) by mouth daily. 03/16/23   Philip Aspen, Limmie Patricia, MD  cyanocobalamin (VITAMIN B12) 1000 MCG/ML  injection Inject 1 mL (1,000 mcg total) into the muscle every 30 (thirty) days. 03/16/23   Philip Aspen, Limmie Patricia, MD  hydrochlorothiazide (HYDRODIURIL) 25 MG tablet TAKE 1 TABLET DAILY (PLEASE SCHEDULE A PHYSICAL FOR MORE REFILLS) 02/08/23   Philip Aspen, Limmie Patricia, MD  lisinopril (ZESTRIL) 40 MG tablet TAKE 1 TABLET BY MOUTH ONCE DAILY. PLEASE SCHEDULE A PHYSICAL FOR MORE REFILLS 03/09/23   Philip Aspen, Limmie Patricia, MD  metFORMIN (GLUCOPHAGE) 500 MG tablet TAKE 1 TABLET DAILY WITH BREAKFAST (PLEASE SCHEDULE A PHYSICAL FOR MORE REFILLS) 01/25/23   Philip Aspen, Limmie Patricia, MD  NEEDLE, DISP, 25 G (B-D DISP NEEDLE 25GX1") 25G X 1" MISC Inject 1,000 mcg of B12 once a month for 6 months. 03/16/23   Philip Aspen, Limmie Patricia, MD  simvastatin (ZOCOR) 40 MG tablet TAKE 1 TABLET BY MOUTH ONCE DAILY WITH BREAKFAST. APPOINTMENT REQUIRED FOR FUTURE REFILLS 03/09/23   Philip Aspen, Limmie Patricia, MD    Family History Family History  Problem Relation Age of Onset   Heart disease Other    Hypertension Other    Obesity Other     Social History Social History   Tobacco Use   Smoking status: Never   Smokeless tobacco: Never  Vaping Use   Vaping Use: Never used  Substance Use Topics   Alcohol use: Yes    Alcohol/week: 12.0 standard drinks of alcohol  Types: 12 Standard drinks or equivalent per week   Drug use: No     Allergies   Patient has no known allergies.   Review of Systems Review of Systems  Genitourinary:  Positive for dysuria.   As per HPI  Physical Exam Triage Vital Signs ED Triage Vitals  Enc Vitals Group     BP 03/21/23 1050 117/74     Pulse Rate 03/21/23 1050 88     Resp 03/21/23 1050 20     Temp 03/21/23 1050 99.7 F (37.6 C)     Temp Source 03/21/23 1050 Oral     SpO2 03/21/23 1050 98 %     Weight --      Height --      Head Circumference --      Peak Flow --      Pain Score 03/21/23 1048 5     Pain Loc --      Pain Edu? --      Excl. in GC? --    No  data found.  Updated Vital Signs BP 117/74 (BP Location: Left Arm) Comment (BP Location): large cuff  Pulse 88   Temp 99.7 F (37.6 C) (Oral)   Resp 20   SpO2 98%   Physical Exam Vitals and nursing note reviewed.  Constitutional:      General: He is not in acute distress.    Appearance: Normal appearance.  HENT:     Mouth/Throat:     Mouth: Mucous membranes are moist.     Pharynx: Oropharynx is clear.  Eyes:     Conjunctiva/sclera: Conjunctivae normal.  Cardiovascular:     Rate and Rhythm: Normal rate and regular rhythm.     Heart sounds: Normal heart sounds.  Pulmonary:     Effort: Pulmonary effort is normal. No respiratory distress.     Breath sounds: Normal breath sounds.  Abdominal:     General: Bowel sounds are normal.     Palpations: Abdomen is soft.     Tenderness: There is no abdominal tenderness. There is no right CVA tenderness, left CVA tenderness, guarding or rebound.  Musculoskeletal:        General: Normal range of motion.  Skin:    General: Skin is warm and dry.  Neurological:     Mental Status: He is alert and oriented to person, place, and time.     UC Treatments / Results  Labs (all labs ordered are listed, but only abnormal results are displayed) Labs Reviewed  POCT URINALYSIS DIP (MANUAL ENTRY) - Abnormal; Notable for the following components:      Result Value   Bilirubin, UA small (*)    Blood, UA large (*)    pH, UA 5.0 (*)    Protein Ur, POC =30 (*)    Leukocytes, UA Moderate (2+) (*)    All other components within normal limits  URINE CULTURE    EKG   Radiology No results found.  Procedures Procedures (including critical care time)  Medications Ordered in UC Medications - No data to display  Initial Impression / Assessment and Plan / UC Course  I have reviewed the triage vital signs and the nursing notes.  Pertinent labs & imaging results that were available during my care of the patient were reviewed by me and considered  in my medical decision making (see chart for details).    UA with large RBCs, moderate leuks Culture pending. Treat with Cipro twice daily for 7 days Discussed side  effects Advised to increase water intake Monitor for worsening symptoms, return if needed.  Follow with primary  Final Clinical Impressions(s) / UC Diagnoses   Final diagnoses:  Lower urinary tract infectious disease     Discharge Instructions      Please take medication as prescribed. Take with food to avoid upset stomach.  Drink at least 64 oz of water daily  Please return with any concerns      ED Prescriptions     Medication Sig Dispense Auth. Provider   ciprofloxacin (CIPRO) 500 MG tablet Take 1 tablet (500 mg total) by mouth every 12 (twelve) hours for 7 days. 14 tablet Livio Ledwith, Lurena Joiner, PA-C      PDMP not reviewed this encounter.   Azjah Pardo, Ray Church 03/21/23 1212

## 2023-03-21 NOTE — ED Triage Notes (Signed)
Painful urination started Thursday after work.  Urine flow has varied, normal stream, or a small amount or drops.  Patient having urgency.    Last BM was yesterday-but not normal.  Denies nausea or vomiting.    Took ibuprofen.  Denies back or abdominal pain

## 2023-03-21 NOTE — Discharge Instructions (Addendum)
Please take medication as prescribed. Take with food to avoid upset stomach.  Drink at least 64 oz of water daily  Please return with any concerns

## 2023-03-22 LAB — URINE CULTURE

## 2023-03-23 LAB — URINE CULTURE: Culture: >=100000 — AB

## 2023-03-31 ENCOUNTER — Encounter: Payer: Self-pay | Admitting: Podiatry

## 2023-03-31 ENCOUNTER — Other Ambulatory Visit: Payer: Self-pay | Admitting: Podiatry

## 2023-03-31 ENCOUNTER — Ambulatory Visit (INDEPENDENT_AMBULATORY_CARE_PROVIDER_SITE_OTHER): Payer: Commercial Managed Care - PPO | Admitting: Podiatry

## 2023-03-31 DIAGNOSIS — M21619 Bunion of unspecified foot: Secondary | ICD-10-CM

## 2023-03-31 DIAGNOSIS — M79675 Pain in left toe(s): Secondary | ICD-10-CM | POA: Diagnosis not present

## 2023-03-31 DIAGNOSIS — E1169 Type 2 diabetes mellitus with other specified complication: Secondary | ICD-10-CM

## 2023-03-31 DIAGNOSIS — B353 Tinea pedis: Secondary | ICD-10-CM | POA: Diagnosis not present

## 2023-03-31 DIAGNOSIS — M109 Gout, unspecified: Secondary | ICD-10-CM

## 2023-03-31 DIAGNOSIS — M79674 Pain in right toe(s): Secondary | ICD-10-CM

## 2023-03-31 DIAGNOSIS — B351 Tinea unguium: Secondary | ICD-10-CM | POA: Diagnosis not present

## 2023-03-31 MED ORDER — KETOCONAZOLE 2 % EX CREA: 1.0000 | TOPICAL_CREAM | Freq: Every day | CUTANEOUS | 2 refills | Status: DC

## 2023-03-31 NOTE — Progress Notes (Signed)
  Subjective:  Patient ID: Jim Green, male    DOB: [birth year],   MRN: [id number]  Chief Complaint  Patient presents with   Nail Problem    Diabetic foot care / exam     62 y.o. male presents for concern of thickened elongated and painful nails that are difficult to trim. Requesting to have them trimmed today. Relates burning and tingling in their feet. Patient is diabetic and last A1c was  Lab Results  Component Value Date   HGBA1C 5.9 (A) 03/16/2023   . Also has question about his left foot bunion and possible gout. Relates the foot has been doing well recently and no pain but wondering if he need surgery.   PCP:  Philip Aspen, Limmie Patricia, MD    . Denies any other pedal complaints. Denies n/v/f/c.   Past Medical History:  Diagnosis Date   Allergy    Diabetes mellitus without complication (HCC)    Eczema    ED (erectile dysfunction)    Hyperlipidemia    Hypertension    Obesity    Sleep apnea     Objective:  Physical Exam: Vascular: DP/PT pulses 2/4 bilateral. CFT <3 seconds. Absent hair growth on digits. Edema noted to bilateral lower extremities. Xerosis noted bilaterally.  Skin. No lacerations or abrasions bilateral feet. Nails 1-5 bilateral  are thickened discolored and elongated with subungual debris.  Musculoskeletal: MMT 5/5 bilateral lower extremities in DF, PF, Inversion and Eversion. Deceased ROM in DF of ankle joint. Severe HAV deformity noted but no pain to palaption and no pain with ROM today.  Neurological: Sensation intact to light touch. Protective sensation diminished bilateral.    Assessment:   1. Pain due to onychomycosis of toenails of both feet   2. Type 2 diabetes mellitus with other specified complication, without long-term current use of insulin (HCC)   3. Tinea pedis of both feet   4. Acute gout of left foot, unspecified cause   5. Bunion      Plan:  Patient was evaluated and treated and all questions answered. -Discussed and educated  patient on diabetic foot care, especially with  regards to the vascular, neurological and musculoskeletal systems.  -Stressed the importance of good glycemic control and the detriment of not  controlling glucose levels in relation to the foot. -Discussed supportive shoes at all times and checking feet regularly.  -Mechanically debrided all nails 1-5 bilateral using sterile nail nipper and filed with dremel without incident  -Answered all patient questions -Briefly discussed gout and likely wound not need surgery at this time. Did discuss if he has a flare in the future to call and we can try and medrol dose pack.  Also discussed tinea and ketoconazole provided.  -Patient to return  in 3 months for at risk foot care -Patient advised to call the office if any problems or questions arise in the meantime.   Louann Sjogren, DPM

## 2023-04-01 NOTE — Telephone Encounter (Signed)
Called pharmacy and provided information for bilateral foot application, for tinea pedis, and 1gm per application, in order for insurance to approve it.

## 2023-04-09 ENCOUNTER — Other Ambulatory Visit: Payer: Self-pay | Admitting: Internal Medicine

## 2023-04-09 DIAGNOSIS — I10 Essential (primary) hypertension: Secondary | ICD-10-CM

## 2023-04-11 ENCOUNTER — Other Ambulatory Visit: Payer: Self-pay | Admitting: Internal Medicine

## 2023-04-11 DIAGNOSIS — E1169 Type 2 diabetes mellitus with other specified complication: Secondary | ICD-10-CM

## 2023-04-26 ENCOUNTER — Other Ambulatory Visit: Payer: Self-pay | Admitting: Internal Medicine

## 2023-04-26 DIAGNOSIS — E118 Type 2 diabetes mellitus with unspecified complications: Secondary | ICD-10-CM

## 2023-05-17 ENCOUNTER — Encounter: Payer: Commercial Managed Care - PPO | Admitting: Internal Medicine

## 2023-05-17 DIAGNOSIS — E538 Deficiency of other specified B group vitamins: Secondary | ICD-10-CM

## 2023-05-17 DIAGNOSIS — E1169 Type 2 diabetes mellitus with other specified complication: Secondary | ICD-10-CM

## 2023-05-17 DIAGNOSIS — E559 Vitamin D deficiency, unspecified: Secondary | ICD-10-CM

## 2023-05-17 DIAGNOSIS — I10 Essential (primary) hypertension: Secondary | ICD-10-CM

## 2023-05-19 ENCOUNTER — Other Ambulatory Visit: Payer: Self-pay | Admitting: Internal Medicine

## 2023-05-19 DIAGNOSIS — I10 Essential (primary) hypertension: Secondary | ICD-10-CM

## 2023-05-19 DIAGNOSIS — E1169 Type 2 diabetes mellitus with other specified complication: Secondary | ICD-10-CM

## 2023-05-22 ENCOUNTER — Other Ambulatory Visit: Payer: Self-pay | Admitting: Internal Medicine

## 2023-05-22 DIAGNOSIS — E1169 Type 2 diabetes mellitus with other specified complication: Secondary | ICD-10-CM

## 2023-06-12 ENCOUNTER — Other Ambulatory Visit: Payer: Self-pay | Admitting: Internal Medicine

## 2023-06-12 DIAGNOSIS — I10 Essential (primary) hypertension: Secondary | ICD-10-CM

## 2023-06-17 ENCOUNTER — Other Ambulatory Visit: Payer: Self-pay | Admitting: Internal Medicine

## 2023-06-17 DIAGNOSIS — I10 Essential (primary) hypertension: Secondary | ICD-10-CM

## 2023-06-28 ENCOUNTER — Other Ambulatory Visit: Payer: Self-pay | Admitting: Internal Medicine

## 2023-06-28 DIAGNOSIS — E1169 Type 2 diabetes mellitus with other specified complication: Secondary | ICD-10-CM

## 2023-07-05 ENCOUNTER — Ambulatory Visit (INDEPENDENT_AMBULATORY_CARE_PROVIDER_SITE_OTHER): Payer: Commercial Managed Care - PPO | Admitting: Podiatry

## 2023-07-05 DIAGNOSIS — Z91199 Patient's noncompliance with other medical treatment and regimen due to unspecified reason: Secondary | ICD-10-CM

## 2023-07-05 NOTE — Progress Notes (Signed)
No show

## 2023-08-06 ENCOUNTER — Other Ambulatory Visit: Payer: Self-pay | Admitting: Internal Medicine

## 2023-08-06 DIAGNOSIS — E118 Type 2 diabetes mellitus with unspecified complications: Secondary | ICD-10-CM

## 2023-11-02 ENCOUNTER — Other Ambulatory Visit: Payer: Self-pay | Admitting: Internal Medicine

## 2023-11-02 DIAGNOSIS — E118 Type 2 diabetes mellitus with unspecified complications: Secondary | ICD-10-CM

## 2024-01-07 ENCOUNTER — Other Ambulatory Visit: Payer: Self-pay | Admitting: Internal Medicine

## 2024-01-07 DIAGNOSIS — E1169 Type 2 diabetes mellitus with other specified complication: Secondary | ICD-10-CM

## 2024-01-17 ENCOUNTER — Other Ambulatory Visit: Payer: Self-pay | Admitting: Internal Medicine

## 2024-01-17 DIAGNOSIS — I10 Essential (primary) hypertension: Secondary | ICD-10-CM

## 2024-02-08 ENCOUNTER — Other Ambulatory Visit: Payer: Self-pay | Admitting: Internal Medicine

## 2024-02-08 DIAGNOSIS — E118 Type 2 diabetes mellitus with unspecified complications: Secondary | ICD-10-CM

## 2024-02-10 ENCOUNTER — Other Ambulatory Visit: Payer: Self-pay | Admitting: Internal Medicine

## 2024-02-10 DIAGNOSIS — E1169 Type 2 diabetes mellitus with other specified complication: Secondary | ICD-10-CM

## 2024-03-25 ENCOUNTER — Other Ambulatory Visit: Payer: Self-pay | Admitting: Internal Medicine

## 2024-03-25 DIAGNOSIS — E1169 Type 2 diabetes mellitus with other specified complication: Secondary | ICD-10-CM

## 2024-03-25 DIAGNOSIS — E118 Type 2 diabetes mellitus with unspecified complications: Secondary | ICD-10-CM

## 2024-04-23 ENCOUNTER — Other Ambulatory Visit: Payer: Self-pay | Admitting: Internal Medicine

## 2024-04-23 DIAGNOSIS — I10 Essential (primary) hypertension: Secondary | ICD-10-CM

## 2024-04-23 DIAGNOSIS — E118 Type 2 diabetes mellitus with unspecified complications: Secondary | ICD-10-CM

## 2024-04-26 ENCOUNTER — Other Ambulatory Visit: Payer: Self-pay | Admitting: Internal Medicine

## 2024-04-26 DIAGNOSIS — E1169 Type 2 diabetes mellitus with other specified complication: Secondary | ICD-10-CM

## 2024-04-26 DIAGNOSIS — E118 Type 2 diabetes mellitus with unspecified complications: Secondary | ICD-10-CM

## 2024-04-27 ENCOUNTER — Other Ambulatory Visit: Payer: Self-pay | Admitting: Internal Medicine

## 2024-04-27 DIAGNOSIS — E1169 Type 2 diabetes mellitus with other specified complication: Secondary | ICD-10-CM

## 2024-04-27 DIAGNOSIS — E118 Type 2 diabetes mellitus with unspecified complications: Secondary | ICD-10-CM

## 2024-04-27 DIAGNOSIS — I10 Essential (primary) hypertension: Secondary | ICD-10-CM

## 2024-04-27 NOTE — Telephone Encounter (Signed)
 Copied from CRM [id number]. Topic: Clinical - Medication Refill >> Apr 27, 2024 10:27 AM Larissa S wrote: Medication:  lisinopril  (ZESTRIL ) 40 MG tablet hydrochlorothiazide  (HYDRODIURIL ) 25 MG tablet simvastatin  (ZOCOR ) 40 MG tablet metFORMIN  (GLUCOPHAGE ) 500 MG tablet  Has the patient contacted their pharmacy? Yes (Agent: If no, request that the patient contact the pharmacy for the refill. If patient does not wish to contact the pharmacy document the reason why and proceed with request.) (Agent: If yes, when and what did the pharmacy advise?)  This is the patient's preferred pharmacy:  [facility] [address], [place] - [address] [place] [address] Phone: [phone] Fax: [phone]    Is this the correct pharmacy for this prescription? Yes If no, delete pharmacy and type the correct one.   Has the prescription been filled recently? No  Is the patient out of the medication? Yes  Has the patient been seen for an appointment in the last year OR does the patient have an upcoming appointment? No  Can we respond through MyChart? No  Agent: Please be advised that Rx refills may take up to 3 business days. We ask that you follow-up with your pharmacy.

## 2024-04-28 ENCOUNTER — Other Ambulatory Visit: Payer: Self-pay | Admitting: Internal Medicine

## 2024-04-28 ENCOUNTER — Ambulatory Visit: Payer: Self-pay

## 2024-04-28 DIAGNOSIS — E118 Type 2 diabetes mellitus with unspecified complications: Secondary | ICD-10-CM

## 2024-04-28 DIAGNOSIS — I10 Essential (primary) hypertension: Secondary | ICD-10-CM

## 2024-04-28 DIAGNOSIS — E1169 Type 2 diabetes mellitus with other specified complication: Secondary | ICD-10-CM

## 2024-04-28 NOTE — Telephone Encounter (Signed)
    FYI Only or Action Required?: FYI only for provider.  Patient was last seen in primary care on 03/16/2023 by Theophilus Andrews, Tully GRADE, MD.  Called Nurse Triage reporting Foot Swelling.  Symptoms began yesterday.  Interventions attempted: Nothing.  Symptoms are: stable.  Triage Disposition: See PCP When Office is Open (Within 3 Days)  Patient/caregiver understands and will follow disposition?: yes pt is out of medication (hydrochlorothiazide )           Copied from CRM [id number]. Topic: Clinical - Red Word Triage >> Apr 28, 2024 10:08 AM Kevelyn M wrote: Patient's foot is starting to swell because he ran out of medication 2 days ago. Patient needs his meds refill. He hadn't be in to see his provider because he didn't have insurance. Now he has medicare and medicaid. I attempted to schedule and appointment but he mentioned swelling in his foot because he doesn't have his meds. Last time he was seen was 02/2023. Reason for Disposition  Unexplained mild or moderate swelling of one ankle or foot  (Exception: swelling is itchy)  Answer Assessment - Initial Assessment Questions 1. ONSET: When did the swelling start? (e.g., minutes, hours, days)     Yesterday  2. LOCATION: What part of the leg is swollen?  Are both legs swollen or just one leg?     Right foot ankle  3. DEGREE OF SWELLING: How large is the swelling?  - LOCALIZED - Small area of swelling on part of one leg (estimate the size) - WIDESPREAD - Swelling involves a large part of leg (calf, thigh or whole leg) or both legs/feet     Ocalized  4. SEVERITY of WIDESPREAD SWELLING (e.g., Edema): How bad is the swelling? - MILD edema - swelling limited to foot and ankle, pitting edema < 1/4 inch deep, rest and elevation eliminate most or all swelling - MODERATE edema - swelling of lower leg to knee, pitting edema > 1/4 inch deep, rest and elevation only partially reduce swelling - SEVERE edema - swelling extends  above knee, facial or hand swelling also present      mild 5. REDNESS: Does the swelling look red or infected?     no 6. PAIN: Is there any pain? If so, ask, How bad is it?     no 7. ITCH: Does the swelling itch? If so, ask, How much?     no 8. CAUSE: What do you think caused the swelling?      Misses dose HCTZ 9. CHRONIC DISEASE: Does your child have kidney, heart or liver disease?     *No Answer*  Protocols used: Leg or Foot Swelling-P-AH

## 2024-05-01 ENCOUNTER — Telehealth: Payer: Self-pay | Admitting: *Deleted

## 2024-05-01 DIAGNOSIS — I10 Essential (primary) hypertension: Secondary | ICD-10-CM

## 2024-05-01 MED ORDER — AMLODIPINE BESYLATE 5 MG PO TABS: 5.0000 mg | ORAL_TABLET | Freq: Every day | ORAL | 0 refills | Status: DC

## 2024-05-01 MED ORDER — HYDROCHLOROTHIAZIDE 25 MG PO TABS: 25.0000 mg | ORAL_TABLET | Freq: Every day | ORAL | 0 refills | Status: DC

## 2024-05-01 MED ORDER — LISINOPRIL 40 MG PO TABS
40.0000 mg | ORAL_TABLET | Freq: Every day | ORAL | 0 refills | Status: DC
Start: 2024-05-01 — End: 2024-07-04

## 2024-05-01 NOTE — Telephone Encounter (Signed)
 Communication  Reason for CRM: patient is requesting a couple day supply of the blood pressure medication until the appt on 07/15.    He is also requesting a follow up call

## 2024-05-01 NOTE — Telephone Encounter (Signed)
 Refill sent.

## 2024-05-01 NOTE — Addendum Note (Signed)
 Addended by: KATHRYNE MILLMAN B on: 05/01/2024 08:20 AM   Modules accepted: Orders

## 2024-05-02 ENCOUNTER — Ambulatory Visit (INDEPENDENT_AMBULATORY_CARE_PROVIDER_SITE_OTHER): Admitting: Internal Medicine

## 2024-05-02 ENCOUNTER — Encounter: Payer: Self-pay | Admitting: Internal Medicine

## 2024-05-02 VITALS — HR 70 | Temp 98.3°F | Wt 253.9 lb

## 2024-05-02 DIAGNOSIS — E1169 Type 2 diabetes mellitus with other specified complication: Secondary | ICD-10-CM

## 2024-05-02 DIAGNOSIS — E538 Deficiency of other specified B group vitamins: Secondary | ICD-10-CM | POA: Diagnosis not present

## 2024-05-02 DIAGNOSIS — E559 Vitamin D deficiency, unspecified: Secondary | ICD-10-CM | POA: Diagnosis not present

## 2024-05-02 DIAGNOSIS — E785 Hyperlipidemia, unspecified: Secondary | ICD-10-CM

## 2024-05-02 DIAGNOSIS — I10 Essential (primary) hypertension: Secondary | ICD-10-CM

## 2024-05-02 LAB — POCT GLYCOSYLATED HEMOGLOBIN (HGB A1C): Hemoglobin A1C: 6 % — AB (ref 4.0–5.6)

## 2024-05-02 MED ORDER — BD SAFETYGLIDE SYRINGE/NEEDLE 25G X 1" 3 ML MISC: 3 refills | Status: AC

## 2024-05-02 MED ORDER — CYANOCOBALAMIN 1000 MCG/ML IJ SOLN: 1000.0000 ug | INTRAMUSCULAR | 1 refills | Status: AC

## 2024-05-02 MED ORDER — FREESTYLE LIBRE 3 PLUS SENSOR MISC: 3 refills | Status: AC

## 2024-05-02 NOTE — Assessment & Plan Note (Signed)
Well controlled on lisinopril 40, amlodipine 5, HCTZ 25 mg daily.

## 2024-05-02 NOTE — Assessment & Plan Note (Signed)
 Check levels today.

## 2024-05-02 NOTE — Assessment & Plan Note (Addendum)
 Well-controlled with an A1c of 6.0.  Continue to work on lifestyle changes.  Check microalbumin today and schedule for diabetic eye exam.

## 2024-05-02 NOTE — Assessment & Plan Note (Signed)
 B12 refilled. Check levels today.

## 2024-05-02 NOTE — Progress Notes (Signed)
 Established Patient Office Visit     CC/Reason for Visit: Follow-up chronic conditions  HPI: Jim Green is a 63 y.o. male who is coming in today for the above mentioned reasons. Past Medical History is significant for: Hypertension, hyperlipidemia, type 2 diabetes, vitamin D  and B12 deficiencies.  He has not been seen in over a year.  Is feeling well, no major complaints.  Is overdue for a wellness visit.   Past Medical/Surgical History: Past Medical History:  Diagnosis Date   Allergy    Diabetes mellitus without complication (HCC)    Eczema    ED (erectile dysfunction)    Hyperlipidemia    Hypertension    Obesity    Sleep apnea     Past Surgical History:  Procedure Laterality Date   KNEE ARTHROSCOPY     right knee   NASAL SINUS SURGERY      Social History:  reports that he has never smoked. He has never used smokeless tobacco. He reports current alcohol use of about 12.0 standard drinks of alcohol per week. He reports that he does not use drugs.  Allergies: No Known Allergies  Family History:  Family History  Problem Relation Age of Onset   Heart disease Other    Hypertension Other    Obesity Other      Current Outpatient Medications:    amLODipine  (NORVASC ) 5 MG tablet, Take 1 tablet (5 mg total) by mouth daily., Disp: 30 tablet, Rfl: 0   Continuous Glucose Sensor (FREESTYLE LIBRE 3 PLUS SENSOR) MISC, Change sensor every 15 days., Disp: 2 each, Rfl: 3   hydrochlorothiazide  (HYDRODIURIL ) 25 MG tablet, Take 1 tablet (25 mg total) by mouth daily., Disp: 30 tablet, Rfl: 0   ketoconazole  (NIZORAL ) 2 % cream, APPLY 1 APPLICATION TOPICALLY DAILY, Disp: 60 g, Rfl: 2   lisinopril  (ZESTRIL ) 40 MG tablet, TAKE 1 TABLET BY MOUTH ONCE DAILY . APPOINTMENT REQUIRED FOR FUTURE REFILLS, Disp: 30 tablet, Rfl: 0   lisinopril  (ZESTRIL ) 40 MG tablet, Take 1 tablet (40 mg total) by mouth daily., Disp: 30 tablet, Rfl: 0   metFORMIN  (GLUCOPHAGE ) 500 MG tablet, TAKE 1 TABLET  BY MOUTH ONCE DAILY WITH BREAKFAST. APPOINTMENT REQUIRED FOR FUTURE REFILLS, Disp: 30 tablet, Rfl: 0   NEEDLE, DISP, 25 G (B-D DISP NEEDLE 25GX1) 25G X 1 MISC, Inject 1,000 mcg of B12 once a month for 6 months., Disp: 100 each, Rfl: 5   simvastatin  (ZOCOR ) 40 MG tablet, TAKE 1 TABLET BY MOUTH ONCE DAILY AT 6PM . APPOINTMENT REQUIRED FOR FUTURE REFILLS, Disp: 30 tablet, Rfl: 0   SYRINGE-NEEDLE, DISP, 3 ML (BD SAFETYGLIDE SYRINGE/NEEDLE) 25G X 1 3 ML MISC, Use for B12 injections, Disp: 100 each, Rfl: 3   cyanocobalamin  (VITAMIN B12) 1000 MCG/ML injection, Inject 1 mL (1,000 mcg total) into the muscle every 30 (thirty) days., Disp: 6 mL, Rfl: 1  Review of Systems:  Negative unless indicated in HPI.   Physical Exam: Vitals:   05/02/24 1522  Pulse: 70  Temp: 98.3 F (36.8 C)  TempSrc: Oral  SpO2: 98%  Weight: 253 lb 14.4 oz (115.2 kg)    Body mass index is 38.61 kg/m.   Physical Exam Vitals reviewed.  Constitutional:      Appearance: Normal appearance. He is obese.  HENT:     Head: Normocephalic and atraumatic.  Eyes:     Conjunctiva/sclera: Conjunctivae normal.  Cardiovascular:     Rate and Rhythm: Normal rate and regular rhythm.  Pulmonary:  Effort: Pulmonary effort is normal.     Breath sounds: Normal breath sounds.  Skin:    General: Skin is warm and dry.  Neurological:     General: No focal deficit present.     Mental Status: He is alert and oriented to person, place, and time.  Psychiatric:        Mood and Affect: Mood normal.        Behavior: Behavior normal.        Thought Content: Thought content normal.        Judgment: Judgment normal.      Impression and Plan:  Type 2 diabetes mellitus with other specified complication, without long-term current use of insulin (HCC) Assessment & Plan: Well-controlled with an A1c of 6.0.  Continue to work on lifestyle changes.  Check microalbumin today and schedule for diabetic eye exam.  Orders: -     POCT  glycosylated hemoglobin (Hb A1C) -     Ambulatory referral to Ophthalmology -     CBC with Differential/Platelet; Future -     Comprehensive metabolic panel with GFR; Future -     Microalbumin / creatinine urine ratio; Future  Essential hypertension Assessment & Plan: Well controlled on lisinopril  40, amlodipine  5, HCTZ 25 mg daily.   Hyperlipidemia associated with type 2 diabetes mellitus (HCC) Assessment & Plan: On simvastatin  40 mg daily. Check lipids today.  Orders: -     Lipid panel; Future  Vitamin D  deficiency Assessment & Plan: Check levels today.  Orders: -     VITAMIN D  25 Hydroxy (Vit-D Deficiency, Fractures); Future  Vitamin B12 deficiency Assessment & Plan: B12 refilled. Check levels today.  Orders: -     Vitamin B12; Future -     Cyanocobalamin ; Inject 1 mL (1,000 mcg total) into the muscle every 30 (thirty) days.  Dispense: 6 mL; Refill: 1  Other orders -     FreeStyle Libre 3 Plus Sensor; Change sensor every 15 days.  Dispense: 2 each; Refill: 3 -     BD SafetyGlide Syringe/Needle; Use for B12 injections  Dispense: 100 each; Refill: 3     Time spent:32 minutes reviewing chart, interviewing and examining patient and formulating plan of care.     Tully Theophilus Andrews, MD [facility]

## 2024-05-02 NOTE — Assessment & Plan Note (Signed)
On simvastatin 40 mg daily.  Check lipids today.

## 2024-05-03 ENCOUNTER — Ambulatory Visit: Payer: Self-pay | Admitting: Internal Medicine

## 2024-05-03 DIAGNOSIS — E876 Hypokalemia: Secondary | ICD-10-CM

## 2024-05-03 DIAGNOSIS — E559 Vitamin D deficiency, unspecified: Secondary | ICD-10-CM

## 2024-05-03 LAB — COMPREHENSIVE METABOLIC PANEL WITH GFR
ALT: 16 U/L (ref 0–53)
AST: 25 U/L (ref 0–37)
Albumin: 3.9 g/dL (ref 3.5–5.2)
Alkaline Phosphatase: 64 U/L (ref 39–117)
BUN: 13 mg/dL (ref 6–23)
CO2: 28 meq/L (ref 19–32)
Calcium: 9.3 mg/dL (ref 8.4–10.5)
Chloride: 101 meq/L (ref 96–112)
Creatinine, Ser: 0.9 mg/dL (ref 0.40–1.50)
GFR: 89.95 mL/min (ref >60.00)
Glucose, Bld: 110 mg/dL — ABNORMAL HIGH (ref 70–99)
Potassium: 2.9 meq/L — ABNORMAL LOW (ref 3.5–5.1)
Sodium: 138 meq/L (ref 135–145)
Total Bilirubin: 0.5 mg/dL (ref 0.2–1.2)
Total Protein: 7.1 g/dL (ref 6.0–8.3)

## 2024-05-03 LAB — LIPID PANEL
Cholesterol: 139 mg/dL (ref 0–200)
HDL: 53.1 mg/dL (ref >39.00)
LDL Cholesterol: 61 mg/dL (ref 0–99)
NonHDL: 85.6
Total CHOL/HDL Ratio: 3
Triglycerides: 122 mg/dL (ref 0.0–149.0)
VLDL: 24.4 mg/dL (ref 0.0–40.0)

## 2024-05-03 LAB — CBC WITH DIFFERENTIAL/PLATELET
Basophils Absolute: 0 K/uL (ref 0.0–0.1)
Basophils Relative: 0.9 % (ref 0.0–3.0)
Eosinophils Absolute: 0.1 K/uL (ref 0.0–0.7)
Eosinophils Relative: 1.2 % (ref 0.0–5.0)
HCT: 38.7 % — ABNORMAL LOW (ref 39.0–52.0)
Hemoglobin: 12.6 g/dL — ABNORMAL LOW (ref 13.0–17.0)
Lymphocytes Relative: 33 % (ref 12.0–46.0)
Lymphs Abs: 1.5 K/uL (ref 0.7–4.0)
MCHC: 32.6 g/dL (ref 30.0–36.0)
MCV: 91.3 fl (ref 78.0–100.0)
Monocytes Absolute: 0.4 K/uL (ref 0.1–1.0)
Monocytes Relative: 9.7 % (ref 3.0–12.0)
Neutro Abs: 2.5 K/uL (ref 1.4–7.7)
Neutrophils Relative %: 55.2 % (ref 43.0–77.0)
Platelets: 216 K/uL (ref 150.0–400.0)
RBC: 4.24 Mil/uL (ref 4.22–5.81)
RDW: 14.9 % (ref 11.5–15.5)
WBC: 4.6 K/uL (ref 4.0–10.5)

## 2024-05-03 LAB — VITAMIN B12: Vitamin B-12: 261 pg/mL (ref 211–911)

## 2024-05-03 LAB — VITAMIN D 25 HYDROXY (VIT D DEFICIENCY, FRACTURES): VITD: 20.64 ng/mL — ABNORMAL LOW (ref 30.00–100.00)

## 2024-05-03 LAB — MICROALBUMIN / CREATININE URINE RATIO
Creatinine,U: 95.7 mg/dL
Microalb Creat Ratio: UNDETERMINED mg/g (ref 0.0–30.0)
Microalb, Ur: <0.7 mg/dL

## 2024-05-03 MED ORDER — VITAMIN D (ERGOCALCIFEROL) 1.25 MG (50000 UNIT) PO CAPS: 50000.0000 [IU] | ORAL_CAPSULE | ORAL | 0 refills | Status: AC

## 2024-05-03 MED ORDER — POTASSIUM CHLORIDE CRYS ER 20 MEQ PO TBCR: 20.0000 meq | EXTENDED_RELEASE_TABLET | Freq: Two times a day (BID) | ORAL | 0 refills | Status: AC

## 2024-05-15 ENCOUNTER — Other Ambulatory Visit (INDEPENDENT_AMBULATORY_CARE_PROVIDER_SITE_OTHER)

## 2024-05-15 DIAGNOSIS — E559 Vitamin D deficiency, unspecified: Secondary | ICD-10-CM | POA: Diagnosis not present

## 2024-05-15 DIAGNOSIS — E876 Hypokalemia: Secondary | ICD-10-CM | POA: Diagnosis not present

## 2024-05-15 LAB — BASIC METABOLIC PANEL WITH GFR
BUN: 16 mg/dL (ref 6–23)
CO2: 28 meq/L (ref 19–32)
Calcium: 9.2 mg/dL (ref 8.4–10.5)
Chloride: 101 meq/L (ref 96–112)
Creatinine, Ser: 0.81 mg/dL (ref 0.40–1.50)
GFR: 92.84 mL/min (ref >60.00)
Glucose, Bld: 110 mg/dL — ABNORMAL HIGH (ref 70–99)
Potassium: 3.7 meq/L (ref 3.5–5.1)
Sodium: 137 meq/L (ref 135–145)

## 2024-05-16 LAB — VITAMIN D 25 HYDROXY (VIT D DEFICIENCY, FRACTURES): VITD: 22.8 ng/mL — ABNORMAL LOW (ref 30.00–100.00)

## 2024-05-17 ENCOUNTER — Telehealth: Payer: Self-pay | Admitting: *Deleted

## 2024-05-17 ENCOUNTER — Ambulatory Visit: Payer: Self-pay | Admitting: Internal Medicine

## 2024-05-17 DIAGNOSIS — E559 Vitamin D deficiency, unspecified: Secondary | ICD-10-CM

## 2024-05-17 NOTE — Telephone Encounter (Signed)
 Copied from CRM [id number]. Topic: Clinical - Lab/Test Results >> May 17, 2024  3:13 PM Rea C wrote: Reason for CRM: Patient returned call for [facility]. Relayed that potassium levels are improved and that his doctor wants to follow up on vitamin D  in 3 months. Patient number is  [phone] (M).  Noted in result note.

## 2024-06-01 ENCOUNTER — Other Ambulatory Visit: Payer: Self-pay | Admitting: Internal Medicine

## 2024-06-01 DIAGNOSIS — I10 Essential (primary) hypertension: Secondary | ICD-10-CM

## 2024-06-01 DIAGNOSIS — E1169 Type 2 diabetes mellitus with other specified complication: Secondary | ICD-10-CM

## 2024-06-01 DIAGNOSIS — E118 Type 2 diabetes mellitus with unspecified complications: Secondary | ICD-10-CM

## 2024-07-04 ENCOUNTER — Other Ambulatory Visit: Payer: Self-pay | Admitting: Internal Medicine

## 2024-07-04 DIAGNOSIS — I10 Essential (primary) hypertension: Secondary | ICD-10-CM

## 2024-07-18 ENCOUNTER — Other Ambulatory Visit: Payer: Self-pay | Admitting: Internal Medicine

## 2024-07-18 DIAGNOSIS — I10 Essential (primary) hypertension: Secondary | ICD-10-CM

## 2024-07-29 ENCOUNTER — Other Ambulatory Visit: Payer: Self-pay | Admitting: Internal Medicine

## 2024-07-29 DIAGNOSIS — I10 Essential (primary) hypertension: Secondary | ICD-10-CM

## 2024-08-02 ENCOUNTER — Ambulatory Visit: Admitting: Internal Medicine

## 2024-08-05 ENCOUNTER — Ambulatory Visit (INDEPENDENT_AMBULATORY_CARE_PROVIDER_SITE_OTHER)

## 2024-08-05 ENCOUNTER — Ambulatory Visit (HOSPITAL_COMMUNITY)
Admission: EM | Admit: 2024-08-05 | Discharge: 2024-08-05 | Disposition: A | Discharge status: Home / Self Care | Attending: Internal Medicine | Admitting: Internal Medicine

## 2024-08-05 ENCOUNTER — Other Ambulatory Visit: Payer: Self-pay

## 2024-08-05 ENCOUNTER — Encounter (HOSPITAL_COMMUNITY): Payer: Self-pay | Admitting: Emergency Medicine

## 2024-08-05 DIAGNOSIS — M25472 Effusion, left ankle: Secondary | ICD-10-CM

## 2024-08-05 DIAGNOSIS — M19072 Primary osteoarthritis, left ankle and foot: Secondary | ICD-10-CM

## 2024-08-05 DIAGNOSIS — M25572 Pain in left ankle and joints of left foot: Secondary | ICD-10-CM

## 2024-08-05 DIAGNOSIS — M7989 Other specified soft tissue disorders: Secondary | ICD-10-CM | POA: Diagnosis not present

## 2024-08-05 NOTE — ED Provider Notes (Signed)
 [facility]    CSN: [id number] Arrival date & time: 08/05/24  1003      History   Chief Complaint Chief Complaint  Patient presents with   Ankle Pain    HPI Jim Green is a 63 y.o. male.   63 year old male presents urgent care with complaints of left ankle pain and swelling.  This started on Thursday.  There was no specific injury.  He has tried taking ibuprofen  without much relief.  He reports last night it was very difficult to sleep due to the pain.  He denies any previous injury to the ankle in the past.  He relates the pain on both the medial and lateral aspect as well as into the posterior aspect.  He does have a history of gout but does not take any routine medications for this.  He has not had gout in his ankle in the past.   Ankle Pain Associated symptoms: no back pain and no fever     Past Medical History:  Diagnosis Date   Allergy    Diabetes mellitus without complication (HCC)    Eczema    ED (erectile dysfunction)    Hyperlipidemia    Hypertension    Obesity    Sleep apnea     Patient Active Problem List   Diagnosis Date Noted   Onychomycosis 03/16/2023   Vitamin D  deficiency 08/17/2019   Vitamin B12 deficiency 08/17/2019   Viral URI with cough 06/27/2018   Mild intermittent asthma without complication 06/27/2018   Abdominal pain 09/29/2016   Osteoarthritis of both knees 03/30/2016   Benign paroxysmal positional vertigo 07/02/2015   DM (diabetes mellitus), type 2 (HCC) 01/31/2014   Back pain, chronic 08/29/2012   Atherosclerosis of aorta 08/29/2012   DYSHIDROSIS 12/17/2009   Hyperlipidemia associated with type 2 diabetes mellitus (HCC) 10/09/2009   ERECTILE DYSFUNCTION 06/26/2008   UMBILICAL HERNIA 06/26/2008   Essential hypertension 06/24/2007    Past Surgical History:  Procedure Laterality Date   KNEE ARTHROSCOPY     right knee   NASAL SINUS SURGERY         Home Medications    Prior to Admission medications    Medication Sig Start Date End Date Taking? Authorizing Provider  predniSONE  (DELTASONE ) 20 MG tablet Take 2 tablets (40 mg total) by mouth daily with breakfast for 5 days. 08/05/24 08/10/24 Yes Emmauel Hallums A, PA-C  amLODipine  (NORVASC ) 5 MG tablet Take 1 tablet by mouth once daily 07/18/24   Theophilus Andrews, Tully GRADE, MD  Continuous Glucose Sensor (FREESTYLE LIBRE 3 PLUS SENSOR) MISC Change sensor every 15 days. 05/02/24   Theophilus Andrews, Tully GRADE, MD  cyanocobalamin  (VITAMIN B12) 1000 MCG/ML injection Inject 1 mL (1,000 mcg total) into the muscle every 30 (thirty) days. 05/02/24   Theophilus Andrews, Tully GRADE, MD  hydrochlorothiazide  (HYDRODIURIL ) 25 MG tablet Take 1 tablet by mouth once daily 06/01/24   Theophilus Andrews, Tully GRADE, MD  ketoconazole  (NIZORAL ) 2 % cream APPLY 1 APPLICATION TOPICALLY DAILY 04/01/23   McCaughan, Dia D, DPM  lisinopril  (ZESTRIL ) 40 MG tablet TAKE 1 TABLET BY MOUTH ONCE DAILY . APPOINTMENT REQUIRED FOR FUTURE REFILLS 05/01/24   Theophilus Andrews, Tully GRADE, MD  lisinopril  (ZESTRIL ) 40 MG tablet Take 1 tablet by mouth once daily 08/01/24   Hernandez Acosta, Estela Y, MD  metFORMIN  (GLUCOPHAGE ) 500 MG tablet TAKE 1 TABLET BY MOUTH ONCE DAILY WITH BREAKFAST. APPOINTMENT REQUIRED FOR FUTURE REFILLS. 06/02/24   Theophilus Andrews, Tully GRADE, MD  NEEDLE,  DISP, 25 G (B-D DISP NEEDLE 25GX1) 25G X 1 MISC Inject 1,000 mcg of B12 once a month for 6 months. 03/16/23   Theophilus Andrews, Tully GRADE, MD  potassium chloride  SA (KLOR-CON  M) 20 MEQ tablet Take 1 tablet (20 mEq total) by mouth 2 (two) times daily for 10 days. 05/03/24 05/13/24  Theophilus Andrews Tully GRADE, MD  simvastatin  (ZOCOR ) 40 MG tablet TAKE 1 TABLET BY MOUTH ONCE DAILY AT  6  PM  *  APPOINTMENT  REQUIRED  FOR  FUTURE  REFILLS  * 06/02/24   Theophilus Andrews, Tully GRADE, MD  SYRINGE-NEEDLE, DISP, 3 ML (BD SAFETYGLIDE SYRINGE/NEEDLE) 25G X 1 3 ML MISC Use for B12 injections 05/02/24   Theophilus Andrews, Tully GRADE, MD    Family  History Family History  Problem Relation Age of Onset   Heart disease Other    Hypertension Other    Obesity Other     Social History Social History   Tobacco Use   Smoking status: Never   Smokeless tobacco: Never  Vaping Use   Vaping status: Never Used  Substance Use Topics   Alcohol use: Yes    Alcohol/week: 12.0 standard drinks of alcohol    Types: 12 Standard drinks or equivalent per week   Drug use: No     Allergies   Patient has no known allergies.   Review of Systems Review of Systems  Constitutional:  Negative for chills and fever.  HENT:  Negative for ear pain and sore throat.   Eyes:  Negative for pain and visual disturbance.  Respiratory:  Negative for cough and shortness of breath.   Cardiovascular:  Negative for chest pain and palpitations.  Gastrointestinal:  Negative for abdominal pain and vomiting.  Genitourinary:  Negative for dysuria and hematuria.  Musculoskeletal:  Negative for arthralgias and back pain.       Left ankle pain and swelling  Skin:  Negative for color change and rash.  Neurological:  Negative for seizures and syncope.  All other systems reviewed and are negative.    Physical Exam Triage Vital Signs ED Triage Vitals  Encounter Vitals Group     BP 08/05/24 1020 (!) 151/74     Girls Systolic BP Percentile --      Girls Diastolic BP Percentile --      Boys Systolic BP Percentile --      Boys Diastolic BP Percentile --      Pulse Rate 08/05/24 1020 68     Resp 08/05/24 1020 18     Temp 08/05/24 1020 99 F (37.2 C)     Temp Source 08/05/24 1020 Oral     SpO2 08/05/24 1020 95 %     Weight --      Height --      Head Circumference --      Peak Flow --      Pain Score 08/05/24 1018 4     Pain Loc --      Pain Education --      Exclude from Growth Chart --    No data found.  Updated Vital Signs BP (!) 151/74 (BP Location: Left Arm) Comment (BP Location): large cuff  Pulse 68   Temp 99 F (37.2 C) (Oral)   Resp 18    SpO2 95%   Visual Acuity Right Eye Distance:   Left Eye Distance:   Bilateral Distance:    Right Eye Near:   Left Eye Near:    Bilateral Near:  Physical Exam Vitals and nursing note reviewed.  Constitutional:      General: He is not in acute distress.    Appearance: He is well-developed.  HENT:     Head: Normocephalic and atraumatic.  Eyes:     Conjunctiva/sclera: Conjunctivae normal.  Cardiovascular:     Rate and Rhythm: Normal rate and regular rhythm.     Heart sounds: No murmur heard. Pulmonary:     Effort: Pulmonary effort is normal. No respiratory distress.     Breath sounds: Normal breath sounds.  Abdominal:     Palpations: Abdomen is soft.     Tenderness: There is no abdominal tenderness.  Musculoskeletal:        General: No swelling.     Cervical back: Neck supple.     Left ankle: Swelling present. No deformity. Tenderness present over the lateral malleolus and medial malleolus. Normal pulse.     Left Achilles Tendon: Tenderness (Very mild) present. No defects.     Left foot: Normal capillary refill. Normal pulse.     Comments: Mild erythema to the medial and lateral malleolus  Skin:    General: Skin is warm and dry.     Capillary Refill: Capillary refill takes less than 2 seconds.  Neurological:     Mental Status: He is alert.  Psychiatric:        Mood and Affect: Mood normal.      UC Treatments / Results  Labs (all labs ordered are listed, but only abnormal results are displayed) Labs Reviewed - No data to display  EKG   Radiology DG Ankle Complete Left Result Date: 08/05/2024 CLINICAL DATA:  Medial and lateral left ankle pain and swelling following a fall. EXAM: LEFT ANKLE COMPLETE - 3+ VIEW COMPARISON:  Left foot dated 11/25/2022. FINDINGS: Mild diffuse soft tissue swelling. Posterior corticated loose body. Talotibial, fibula tibial and posterior subtalar degenerative changes. No fracture, dislocation or visible effusion. IMPRESSION: 1. No  fracture. 2. Degenerative changes. Electronically Signed   By: Elspeth Bathe M.D.   On: 08/05/2024 10:51    Procedures Procedures (including critical care time)  Medications Ordered in UC Medications - No data to display  Initial Impression / Assessment and Plan / UC Course  I have reviewed the triage vital signs and the nursing notes.  Pertinent labs & imaging results that were available during my care of the patient were reviewed by me and considered in my medical decision making (see chart for details).     Pain and swelling of left ankle - Plan: DG Ankle Complete Left, DG Ankle Complete Left  Inflammation of left ankle joint   X-ray of the left ankle done today.  Final evaluation by the radiologist does not show any acute fractures although there is some degenerative changes.  Symptoms and physical exam findings are most consistent with inflammation and the left ankle, possibly gout.  We can treat with stronger anti-inflammatories and support for the ankle.  We will treat with the following: Prednisone  40 mg (2 tablets) once daily for 5 days. Take this in the morning.  This is a steroid to help with inflammation and pain.  Do not take ibuprofen  while you are taking prednisone .  Monitor your blood sugars while you are on this medication as this can cause a increase in your blood sugar.  If you have a significant increase in your blood sugar then discontinue the prednisone  and go to the emergency room for further evaluation. Ice the area 2-3 times  daily for 10-15 minutes to help with pain and swelling. Do not apply ice directly to the skin.  Wear compression wrap during the day when active. Remove at night. Remove if numbness, tingling or increased pain occurs. Do this for 5-7 days then may discontinue and increase activity as tolerated.  If you are not having much relief, may want to consider following up with podiatry. Return to urgent care or PCP if symptoms worsen or fail to resolve.     Final Clinical Impressions(s) / UC Diagnoses   Final diagnoses:  Pain and swelling of left ankle  Inflammation of left ankle joint     Discharge Instructions      X-ray of the left ankle done today.  Final evaluation by the radiologist does not show any acute fractures although there is some degenerative changes.  Symptoms and physical exam findings are most consistent with inflammation and the left ankle, possibly gout.  We can treat with stronger anti-inflammatories and support for the ankle.  We will treat with the following: Prednisone  40 mg (2 tablets) once daily for 5 days. Take this in the morning.  This is a steroid to help with inflammation and pain.  Do not take ibuprofen  while you are taking prednisone .  Monitor your blood sugars while you are on this medication as this can cause a increase in your blood sugar.  If you have a significant increase in your blood sugar then discontinue the prednisone  and go to the emergency room for further evaluation. Ice the area 2-3 times daily for 10-15 minutes to help with pain and swelling. Do not apply ice directly to the skin.  Wear compression wrap during the day when active. Remove at night. Remove if numbness, tingling or increased pain occurs. Do this for 5-7 days then may discontinue and increase activity as tolerated.  If you are not having much relief, may want to consider following up with podiatry. Return to urgent care or PCP if symptoms worsen or fail to resolve.       ED Prescriptions     Medication Sig Dispense Auth. Provider   predniSONE  (DELTASONE ) 20 MG tablet Take 2 tablets (40 mg total) by mouth daily with breakfast for 5 days. 10 tablet Teresa Almarie LABOR, [place]      PDMP not reviewed this encounter.   Teresa Almarie LABOR, [place] 08/05/24 1109

## 2024-08-05 NOTE — ED Notes (Signed)
Declined work note

## 2024-08-05 NOTE — Discharge Instructions (Addendum)
 X-ray of the left ankle done today.  Final evaluation by the radiologist does not show any acute fractures although there is some degenerative changes.  Symptoms and physical exam findings are most consistent with inflammation and the left ankle, possibly gout.  We can treat with stronger anti-inflammatories and support for the ankle.  We will treat with the following: Prednisone  40 mg (2 tablets) once daily for 5 days. Take this in the morning.  This is a steroid to help with inflammation and pain.  Do not take ibuprofen  while you are taking prednisone .  Monitor your blood sugars while you are on this medication as this can cause a increase in your blood sugar.  If you have a significant increase in your blood sugar then discontinue the prednisone  and go to the emergency room for further evaluation. Ice the area 2-3 times daily for 10-15 minutes to help with pain and swelling. Do not apply ice directly to the skin.  Wear compression wrap during the day when active. Remove at night. Remove if numbness, tingling or increased pain occurs. Do this for 5-7 days then may discontinue and increase activity as tolerated.  If you are not having much relief, may want to consider following up with podiatry. Return to urgent care or PCP if symptoms worsen or fail to resolve.

## 2024-08-05 NOTE — ED Triage Notes (Signed)
 Thursday afternoon, patient noticed issues with left ankle.  Patient has swelling in left ankle, pain is from each side of ankle around the to the back of the foot.  Patient denies any injury, patient has been on his feet a lot lately.    Patient took ibuprofen  around 7 am today.    Has not taken blood pressure medicines today

## 2024-08-25 ENCOUNTER — Other Ambulatory Visit: Payer: Self-pay | Admitting: Internal Medicine

## 2024-08-25 DIAGNOSIS — I10 Essential (primary) hypertension: Secondary | ICD-10-CM

## 2024-08-26 ENCOUNTER — Other Ambulatory Visit: Payer: Self-pay | Admitting: Internal Medicine

## 2024-08-26 DIAGNOSIS — E1169 Type 2 diabetes mellitus with other specified complication: Secondary | ICD-10-CM

## 2024-08-26 DIAGNOSIS — E118 Type 2 diabetes mellitus with unspecified complications: Secondary | ICD-10-CM

## 2024-10-25 ENCOUNTER — Encounter (HOSPITAL_COMMUNITY): Payer: Self-pay

## 2024-10-25 ENCOUNTER — Ambulatory Visit (HOSPITAL_COMMUNITY)

## 2024-10-25 ENCOUNTER — Ambulatory Visit (HOSPITAL_COMMUNITY): Admission: EM | Admit: 2024-10-25 | Discharge: 2024-10-25 | Disposition: A | Discharge status: Home / Self Care

## 2024-10-25 DIAGNOSIS — J069 Acute upper respiratory infection, unspecified: Secondary | ICD-10-CM

## 2024-10-25 DIAGNOSIS — M25522 Pain in left elbow: Secondary | ICD-10-CM

## 2024-10-25 DIAGNOSIS — I10 Essential (primary) hypertension: Secondary | ICD-10-CM

## 2024-10-25 MED ORDER — PREDNISONE 20 MG PO TABS: 40.0000 mg | ORAL_TABLET | Freq: Every day | ORAL | 0 refills | Status: AC

## 2024-10-25 NOTE — ED Triage Notes (Signed)
 Pt c/o cough and congestion since Friday. C/o lt elbow pain and swelling x2 days. Denies injury or taken any meds.

## 2024-10-25 NOTE — Discharge Instructions (Addendum)
 Your elbow x-ray is negative for fracture or dislocation.  Symptoms and physical exam findings are most consistent with inflammation in the left elbow, possibly gout.    You have been given a prescription for prednisone .  Take 2 tablets daily for 5 days.  This is a steroid to help with inflammation and pain.  Do not take ibuprofen  while you are taking prednisone .  Monitor your blood sugars while you are on this medication as this can cause a increase in your blood sugar.  If you have a significant increase in your blood sugar then discontinue the prednisone  and go to the emergency room for further evaluation.  Apply ice on for 15-20 minutes several times daily.  Please follow-up with your primary care provider for further management.  You may benefit from maintenance medications.  Your blood pressure was elevated today.  Please take your blood pressure medications when you get home.  For your cough and nasal congestion, you likely have a viral illness which will improve on its own with rest, fluids, and medications to help with your symptoms.  Tylenol , guaifenesin (plain mucinex), and saline nasal sprays may help relieve symptoms.   Two teaspoons of honey in 1 cup of warm water every 4-6 hours may help with throat pains.  Humidifier in room at nighttime may help soothe cough (clean well daily).   For chest pain, shortness of breath, inability to keep food or fluids down without vomiting, fever that does not respond to tylenol  or motrin , or any other severe symptoms, please go to the ER for further evaluation. Return to urgent care as needed, otherwise follow-up with PCP.    If you develop any new or worsening symptoms or if your symptoms do not start to improve, please return here or follow-up with your primary care provider.  If your symptoms are severe, please go to the emergency room.

## 2024-10-25 NOTE — ED Provider Notes (Signed)
 " [facility]    CSN: [id number] Arrival date & time: 10/25/24  0840      History   Chief Complaint Chief Complaint  Patient presents with   Cough   Joint Swelling    HPI Jim Green is a 64 y.o. male.   This 64 year old male is being seen for complaints of cough and congestion for 5 days as well as left elbow pain and swelling for 2 days.  He reports history of chronic sinus problems.  He says he has had a lot of drainage to the back of his throat and coughing up phlegm.  He denies fever, chills, ear pain, sore throat.  He denies shortness of breath, chest pain, abdominal pain, nausea, vomiting, diarrhea.  He denies injury or trauma to his elbow.  He reports he woke up and it was swollen.  He denies known history of gout.  He says this is the first time that his elbow has swollen like this.  His blood pressure is elevated today.  He says he takes antihypertensive medications, but did not take this morning as he was coming to urgent care.  Patient advised to take medication when he gets home.   Cough Associated symptoms: no chest pain, no chills, no ear pain, no fever, no headaches, no rhinorrhea, no shortness of breath and no sore throat     Past Medical History:  Diagnosis Date   Allergy    Diabetes mellitus without complication (HCC)    Eczema    ED (erectile dysfunction)    Hyperlipidemia    Hypertension    Obesity    Sleep apnea     Patient Active Problem List   Diagnosis Date Noted   Onychomycosis 03/16/2023   Vitamin D  deficiency 08/17/2019   Vitamin B12 deficiency 08/17/2019   Viral URI with cough 06/27/2018   Mild intermittent asthma without complication 06/27/2018   Abdominal pain 09/29/2016   Osteoarthritis of both knees 03/30/2016   Benign paroxysmal positional vertigo 07/02/2015   DM (diabetes mellitus), type 2 (HCC) 01/31/2014   Back pain, chronic 08/29/2012   Atherosclerosis of aorta 08/29/2012   DYSHIDROSIS 12/17/2009   Hyperlipidemia  associated with type 2 diabetes mellitus (HCC) 10/09/2009   ERECTILE DYSFUNCTION 06/26/2008   UMBILICAL HERNIA 06/26/2008   Essential hypertension 06/24/2007    Past Surgical History:  Procedure Laterality Date   KNEE ARTHROSCOPY     right knee   NASAL SINUS SURGERY         Home Medications    Prior to Admission medications  Medication Sig Start Date End Date Taking? Authorizing Provider  amLODipine  (NORVASC ) 5 MG tablet Take 1 tablet by mouth once daily 07/18/24   Theophilus Andrews, Tully GRADE, MD  Continuous Glucose Sensor (FREESTYLE LIBRE 3 PLUS SENSOR) MISC Change sensor every 15 days. 05/02/24   Theophilus Andrews, Tully GRADE, MD  cyanocobalamin  (VITAMIN B12) 1000 MCG/ML injection Inject 1 mL (1,000 mcg total) into the muscle every 30 (thirty) days. 05/02/24   Theophilus Andrews, Tully GRADE, MD  hydrochlorothiazide  (HYDRODIURIL ) 25 MG tablet Take 1 tablet by mouth once daily 06/01/24   Theophilus Andrews, Tully GRADE, MD  ketoconazole  (NIZORAL ) 2 % cream APPLY 1 APPLICATION TOPICALLY DAILY 04/01/23   McCaughan, Dia D, DPM  lisinopril  (ZESTRIL ) 40 MG tablet TAKE 1 TABLET BY MOUTH ONCE DAILY . APPOINTMENT REQUIRED FOR FUTURE REFILLS 05/01/24   Theophilus Andrews, Tully GRADE, MD  lisinopril  (ZESTRIL ) 40 MG tablet Take 1 tablet by mouth once daily 08/28/24  Theophilus Andrews, Tully GRADE, MD  metFORMIN  (GLUCOPHAGE ) 500 MG tablet Take 1 tablet (500 mg total) by mouth daily with breakfast. 08/28/24   Theophilus Andrews, Tully GRADE, MD  NEEDLE, DISP, 25 G (B-D DISP NEEDLE 25GX1) 25G X 1 MISC Inject 1,000 mcg of B12 once a month for 6 months. 03/16/23   Theophilus Andrews, Tully GRADE, MD  potassium chloride  SA (KLOR-CON  M) 20 MEQ tablet Take 1 tablet (20 mEq total) by mouth 2 (two) times daily for 10 days. 05/03/24 05/13/24  Theophilus Andrews, Tully GRADE, MD  predniSONE  (DELTASONE ) 20 MG tablet Take 2 tablets (40 mg total) by mouth daily with breakfast for 5 days. 10/25/24 10/30/24 Yes Marybeth Dandy C, FNP  simvastatin  (ZOCOR ) 40  MG tablet TAKE 1 TABLET BY MOUTH ONCE DAILY AT  [facility] 08/28/24   Theophilus Andrews, Tully GRADE, MD  SYRINGE-NEEDLE, DISP, 3 ML (BD SAFETYGLIDE SYRINGE/NEEDLE) 25G X 1 3 ML MISC Use for B12 injections 05/02/24   Theophilus Andrews, Tully GRADE, MD    Family History Family History  Problem Relation Age of Onset   Heart disease Other    Hypertension Other    Obesity Other     Social History Social History[1]   Allergies   Patient has no known allergies.   Review of Systems Review of Systems  Constitutional:  Positive for activity change. Negative for appetite change, chills and fever.  HENT:  Positive for congestion. Negative for ear pain, rhinorrhea and sore throat.   Respiratory:  Positive for cough. Negative for shortness of breath.   Cardiovascular:  Negative for chest pain.  Gastrointestinal:  Negative for abdominal pain, diarrhea, nausea and vomiting.  Musculoskeletal:  Positive for arthralgias and joint swelling.  Skin:  Negative for color change.  Neurological:  Negative for dizziness and headaches.     Physical Exam Triage Vital Signs ED Triage Vitals  Encounter Vitals Group     BP 10/25/24 1036 (!) 183/92     Girls Systolic BP Percentile --      Girls Diastolic BP Percentile --      Boys Systolic BP Percentile --      Boys Diastolic BP Percentile --      Pulse Rate 10/25/24 1036 67     Resp 10/25/24 1036 18     Temp 10/25/24 1036 98.2 F (36.8 C)     Temp Source 10/25/24 1036 Oral     SpO2 10/25/24 1036 97 %     Weight --      Height --      Head Circumference --      Peak Flow --      Pain Score 10/25/24 1035 7     Pain Loc --      Pain Education --      Exclude from Growth Chart --    No data found.  Updated Vital Signs BP (!) 183/92 (BP Location: Right Arm)   Pulse 67   Temp 98.2 F (36.8 C) (Oral)   Resp 18   SpO2 97%   Visual Acuity Right Eye Distance:   Left Eye Distance:   Bilateral Distance:    Right Eye Near:   Left Eye Near:     Bilateral Near:     Physical Exam Vitals and nursing note reviewed.  Constitutional:      General: He is not in acute distress.    Appearance: He is well-developed. He is not toxic-appearing.     Comments: Pleasant male appearing stated age  found sitting in chair in no acute distress.  HENT:     Head: Normocephalic and atraumatic.     Right Ear: Tympanic membrane and external ear normal.     Left Ear: Tympanic membrane and external ear normal.     Nose: Congestion present.     Right Turbinates: Enlarged.     Left Turbinates: Enlarged.     Mouth/Throat:     Lips: Pink.     Mouth: Mucous membranes are moist.     Pharynx: No pharyngeal swelling or oropharyngeal exudate.  Eyes:     Conjunctiva/sclera: Conjunctivae normal.  Cardiovascular:     Rate and Rhythm: Normal rate and regular rhythm.     Heart sounds: Normal heart sounds. No murmur heard. Pulmonary:     Effort: Pulmonary effort is normal. No respiratory distress.     Breath sounds: Normal breath sounds.  Musculoskeletal:     Left elbow: Swelling present. Tenderness present.       Arms:  Skin:    General: Skin is warm and dry.     Capillary Refill: Capillary refill takes less than 2 seconds.     Findings: No abscess, erythema, signs of injury, rash or wound.  Neurological:     Mental Status: He is alert.  Psychiatric:        Mood and Affect: Mood normal.      UC Treatments / Results  Labs (all labs ordered are listed, but only abnormal results are displayed) Labs Reviewed - No data to display  EKG   Radiology DG Elbow Complete Left Result Date: 10/25/2024 EXAM: 3 VIEW(S) XRAY OF THE LEFT ELBOW COMPARISON: None available. CLINICAL HISTORY: pain and swelling FINDINGS: BONES AND JOINTS: No acute fracture. No malalignment. Enthesophytes at medial and lateral epicondyles of distal humerus. Olecranon enthesophyte. No posterior elbow joint effusion. No definite anterior elbow joint effusion. SOFT TISSUES:  Unremarkable. IMPRESSION: 1. No acute osseous abnormality. Electronically signed by: Morgane Naveau MD 10/25/2024 11:05 AM EST RP Workstation: [id number]    Procedures Procedures (including critical care time)  Medications Ordered in UC Medications - No data to display  Initial Impression / Assessment and Plan / UC Course  I have reviewed the triage vital signs and the nursing notes.  Pertinent labs & imaging results that were available during my care of the patient were reviewed by me and considered in my medical decision making (see chart for details).     Vitals and triage reviewed, patient is hemodynamically stable.  He is hypertensive in clinic today.  He states he did not take his blood pressure medicine this morning prior to coming to urgent care.  Left elbow x-ray obtained and is negative for acute findings.  Presentation consistent with gout.  He has been treated with steroids for gout in the past, however does not have an official diagnosis.  He is given a prescription for prednisone .  Advised to closely follow his glucose levels and stop prednisone  if severe increase in glucose levels.  He also presents with symptoms consistent with viral respiratory illness.  He is advised supportive care with Tylenol , guaifenesin, saline nasal spray, honey water, humidifier.  Plan of care, follow-up care, return precautions given, no questions at this time. Final Clinical Impressions(s) / UC Diagnoses   Final diagnoses:  Elbow pain, left     Discharge Instructions      Your elbow x-ray is negative for fracture or dislocation.  Symptoms and physical exam findings are most consistent with inflammation in  the left elbow, possibly gout.    You have been given a prescription for prednisone .  Take 2 tablets daily for 5 days.  This is a steroid to help with inflammation and pain.  Do not take ibuprofen  while you are taking prednisone .  Monitor your blood sugars while you are on this medication as  this can cause a increase in your blood sugar.  If you have a significant increase in your blood sugar then discontinue the prednisone  and go to the emergency room for further evaluation.  Apply ice on for 15-20 minutes several times daily.  Please follow-up with your primary care provider for further management.  You may benefit from maintenance medications.  If you develop any new or worsening symptoms or if your symptoms do not start to improve, please return here or follow-up with your primary care provider.  If your symptoms are severe, please go to the emergency room.     ED Prescriptions     Medication Sig Dispense Auth. Provider   predniSONE  (DELTASONE ) 20 MG tablet Take 2 tablets (40 mg total) by mouth daily with breakfast for 5 days. 10 tablet Densel Kronick C, FNP      PDMP not reviewed this encounter.    [1]  Social History Tobacco Use   Smoking status: Never   Smokeless tobacco: Never  Vaping Use   Vaping status: Never Used  Substance Use Topics   Alcohol use: Yes    Alcohol/week: 12.0 standard drinks of alcohol    Types: 12 Standard drinks or equivalent per week   Drug use: No     Lennice Jon BROCKS, FNP 10/25/24 1206  "

## 2024-11-04 ENCOUNTER — Other Ambulatory Visit: Payer: Self-pay | Admitting: Internal Medicine

## 2024-11-04 DIAGNOSIS — I10 Essential (primary) hypertension: Secondary | ICD-10-CM
